# Patient Record
Sex: Male | Born: 1955 | Race: White | Hispanic: No | Marital: Single | Smoking: Former smoker
Health system: Southern US, Community
[De-identification: ages and names within clinical notes are randomized; demographics above are authoritative.]

## PROBLEM LIST (undated history)

## (undated) ENCOUNTER — Emergency Department (HOSPITAL_COMMUNITY): Discharge status: Absent without leave | Payer: Self-pay

## (undated) DIAGNOSIS — J189 Pneumonia, unspecified organism: Secondary | ICD-10-CM

## (undated) DIAGNOSIS — K611 Rectal abscess: Secondary | ICD-10-CM

## (undated) HISTORY — PX: SHOULDER SURGERY: SHX246

---

## 2013-12-23 ENCOUNTER — Encounter (HOSPITAL_COMMUNITY): Payer: Self-pay | Admitting: Emergency Medicine

## 2013-12-23 ENCOUNTER — Emergency Department (HOSPITAL_COMMUNITY)
Admission: EM | Admit: 2013-12-23 | Discharge: 2013-12-23 | Disposition: A | Discharge status: Home / Self Care | Payer: Self-pay | Attending: Emergency Medicine | Admitting: Emergency Medicine

## 2013-12-23 ENCOUNTER — Emergency Department (HOSPITAL_COMMUNITY): Payer: Self-pay

## 2013-12-23 DIAGNOSIS — R197 Diarrhea, unspecified: Secondary | ICD-10-CM | POA: Insufficient documentation

## 2013-12-23 DIAGNOSIS — Z87891 Personal history of nicotine dependence: Secondary | ICD-10-CM | POA: Insufficient documentation

## 2013-12-23 DIAGNOSIS — Z79899 Other long term (current) drug therapy: Secondary | ICD-10-CM | POA: Insufficient documentation

## 2013-12-23 DIAGNOSIS — K611 Rectal abscess: Secondary | ICD-10-CM

## 2013-12-23 DIAGNOSIS — K612 Anorectal abscess: Secondary | ICD-10-CM | POA: Insufficient documentation

## 2013-12-23 DIAGNOSIS — Z792 Long term (current) use of antibiotics: Secondary | ICD-10-CM | POA: Insufficient documentation

## 2013-12-23 DIAGNOSIS — R509 Fever, unspecified: Secondary | ICD-10-CM | POA: Insufficient documentation

## 2013-12-23 LAB — CBC WITH DIFFERENTIAL/PLATELET
Basophils Absolute: 0 10*3/uL (ref 0.0–0.1)
Basophils Relative: 0 % (ref 0–1)
EOS ABS: 0.1 10*3/uL (ref 0.0–0.7)
Eosinophils Relative: 1 % (ref 0–5)
HCT: 43.1 % (ref 39.0–52.0)
HEMOGLOBIN: 14.8 g/dL (ref 13.0–17.0)
LYMPHS PCT: 18 % (ref 12–46)
Lymphs Abs: 1.7 10*3/uL (ref 0.7–4.0)
MCH: 29.4 pg (ref 26.0–34.0)
MCHC: 34.3 g/dL (ref 30.0–36.0)
MCV: 85.5 fL (ref 78.0–100.0)
Monocytes Absolute: 1 10*3/uL (ref 0.1–1.0)
Monocytes Relative: 10 % (ref 3–12)
NEUTROS ABS: 6.6 10*3/uL (ref 1.7–7.7)
NEUTROS PCT: 71 % (ref 43–77)
PLATELETS: 269 10*3/uL (ref 150–400)
RBC: 5.04 MIL/uL (ref 4.22–5.81)
RDW: 12.8 % (ref 11.5–15.5)
WBC: 9.3 10*3/uL (ref 4.0–10.5)

## 2013-12-23 LAB — BASIC METABOLIC PANEL
BUN: 11 mg/dL (ref 6–23)
CALCIUM: 9 mg/dL (ref 8.4–10.5)
CO2: 26 mEq/L (ref 19–32)
Chloride: 98 mEq/L (ref 96–112)
Creatinine, Ser: 0.91 mg/dL (ref 0.50–1.35)
GFR calc Af Amer: >90 mL/min (ref >90)
Glucose, Bld: 111 mg/dL — ABNORMAL HIGH (ref 70–99)
POTASSIUM: 4 meq/L (ref 3.7–5.3)
Sodium: 138 mEq/L (ref 137–147)

## 2013-12-23 LAB — URINALYSIS, ROUTINE W REFLEX MICROSCOPIC
Bilirubin Urine: NEGATIVE
Glucose, UA: NEGATIVE mg/dL
Ketones, ur: NEGATIVE mg/dL
LEUKOCYTES UA: NEGATIVE
Nitrite: NEGATIVE
PROTEIN: NEGATIVE mg/dL
SPECIFIC GRAVITY, URINE: 1.015 (ref 1.005–1.030)
UROBILINOGEN UA: 0.2 mg/dL (ref 0.0–1.0)
pH: 5 (ref 5.0–8.0)

## 2013-12-23 LAB — URINE MICROSCOPIC-ADD ON

## 2013-12-23 MED ORDER — MORPHINE SULFATE 4 MG/ML IJ SOLN
4.0000 mg | Freq: Once | INTRAMUSCULAR | Status: AC
  Administered 2013-12-23: 4 mg via INTRAVENOUS
  Filled 2013-12-23: qty 1

## 2013-12-23 MED ORDER — METRONIDAZOLE 500 MG PO TABS: 500.0000 mg | ORAL_TABLET | Freq: Three times a day (TID) | ORAL | Status: DC

## 2013-12-23 MED ORDER — IOHEXOL 300 MG/ML  SOLN
100.0000 mL | Freq: Once | INTRAMUSCULAR | Status: AC | PRN
  Administered 2013-12-23: 100 mL via INTRAVENOUS

## 2013-12-23 MED ORDER — SODIUM CHLORIDE 0.9 % IV BOLUS (SEPSIS)
500.0000 mL | Freq: Once | INTRAVENOUS | Status: AC
  Administered 2013-12-23: 500 mL via INTRAVENOUS

## 2013-12-23 MED ORDER — IOHEXOL 300 MG/ML  SOLN: 100.0000 mL | Freq: Once | INTRAMUSCULAR | Status: DC | PRN

## 2013-12-23 MED ORDER — CIPROFLOXACIN HCL 500 MG PO TABS: 500.0000 mg | ORAL_TABLET | Freq: Two times a day (BID) | ORAL | Status: DC

## 2013-12-23 MED ORDER — HYDROMORPHONE HCL PF 1 MG/ML IJ SOLN
0.5000 mg | Freq: Once | INTRAMUSCULAR | Status: AC
  Administered 2013-12-23: 0.5 mg via INTRAVENOUS
  Filled 2013-12-23: qty 1

## 2013-12-23 MED ORDER — POLYETHYLENE GLYCOL 3350 17 G PO PACK: 17.0000 g | PACK | Freq: Every day | ORAL | Status: AC

## 2013-12-23 MED ORDER — OXYCODONE-ACETAMINOPHEN 5-325 MG PO TABS: 1.0000 | ORAL_TABLET | Freq: Four times a day (QID) | ORAL | Status: DC | PRN

## 2013-12-23 NOTE — Care Management Note (Signed)
ED/CM noted patient did not have health insurance and/or PCP listed in the computer.  Patient was given the [place] resource handout with information on the clinics, food pantries, and the handout for new health insurance sign-up.  Patient expressed appreciation for information received. 

## 2013-12-23 NOTE — Discharge Instructions (Signed)
Peri-Rectal Abscess °Your caregiver has diagnosed you as having a peri-rectal abscess. This is an infected area near the rectum that is filled with pus. If the abscess is near the surface of the skin, your caregiver may open (incise) the area and drain the pus. °HOME CARE INSTRUCTIONS  °· If your abscess was opened up and drained. A small piece of gauze may be placed in the opening so that it can drain. Do not remove the gauze unless directed by your caregiver. °· A loose dressing may be placed over the abscess site. Change the dressing as often as necessary to keep it clean and dry. °· After the drain is removed, the area may be washed with a gentle antiseptic (soap) four times per day. °· A warm sitz bath, warm packs or heating pad may be used for pain relief, taking care not to burn yourself. °· Return for a wound check in 1 day or as directed. °· An "inflatable doughnut" may be used for sitting with added comfort. These can be purchased at a drugstore or medical supply house. °· To reduce pain and straining with bowel movements, eat a high fiber diet with plenty of fruits and vegetables. Use stool softeners as recommended by your caregiver. This is especially important if narcotic type pain medications were prescribed as these may cause marked constipation. °· Only take over-the-counter or prescription medicines for pain, discomfort, or fever as directed by your caregiver. °SEEK IMMEDIATE MEDICAL CARE IF:  °· You have increasing pain that is not controlled by medication. °· There is increased inflammation (redness), swelling, bleeding, or drainage from the area. °· An oral temperature above 102° F (38.9° C) develops. °· You develop chills or generalized malaise (feel lethargic or feel "washed out"). °· You develop any new symptoms (problems) you feel may be related to your present problem. °Document Released: 07/07/2000 Document Revised: 10/02/2011 Document Reviewed: 07/07/2008 °[employer]® Patient Information  ©2014 [employer]. ° °

## 2013-12-23 NOTE — ED Notes (Signed)
Pt c/o rectal pain that "radiates to my private parts" since Saturday. Pt also reports difficulty urinating and decreased BMs. Pt also reports hx of "prostate problems".

## 2013-12-23 NOTE — ED Notes (Signed)
Pt c/o rectal pain that radiates to testicle area, difficulty having bowel movements and urination, denies any blood in urine or stool, states that he is able to have bowel movement and urination but that they are "different"

## 2013-12-23 NOTE — ED Provider Notes (Signed)
CSN: [id number]     Arrival date & time 12/23/13  1003 History  This chart was scribed for [employer]. Rubin Payor, MD,  by Ashley Jacobs, ED Scribe. The patient was seen in room APA11/APA11 and the patient's care was started at 10:34 AM.    First MD Initiated Contact with Patient 12/23/13 1007     Chief Complaint  Patient presents with  . Rectal Pain     (Consider location/radiation/quality/duration/timing/severity/associated sxs/prior Treatment) The history is provided by the patient and medical records. No language interpreter was used.   HPI Comments: Terry [name] is a 58 y.o. male who presents to the Emergency Department complaining of rectal pain, onset four days ago.  Denies injury or trauma. The pain is between his rectum and his genital. Pt has the associated symptoms of sacrum pain, rectal swelling, difficulty urinating, loose painful BM, fever (99.2) and decreased BM.  He tried Preparation H without any relief. Pt has a hx of prostatitis and was treated with antibiotics. Denies any allergies to medication.    History reviewed. No pertinent past medical history. Past Surgical History  Procedure Laterality Date  . Shoulder surgery     History reviewed. No pertinent family history. History  Substance Use Topics  . Smoking status: Former Games developer  . Smokeless tobacco: Not on file  . Alcohol Use: No    Review of Systems  Constitutional: Positive for fever.  Gastrointestinal: Positive for diarrhea and rectal pain.  Genitourinary: Positive for difficulty urinating.       Groin  pain  Musculoskeletal: Positive for arthralgias.  All other systems reviewed and are negative.     Allergies  Review of patient's allergies indicates no known allergies.  Home Medications   Prior to Admission medications   Medication Sig Start Date End Date Taking? Authorizing Provider  acetaminophen (TYLENOL) 500 MG tablet Take 1,500 mg by mouth daily as needed for moderate pain.   Yes  Historical Provider, MD  Docusate Sodium (DULCOLAX STOOL SOFTENER PO) Take 3 tablets by mouth daily as needed (constipation).   Yes Historical Provider, MD  HYDROcodone-acetaminophen (NORCO/VICODIN) 5-325 MG per tablet Take 2 tablets by mouth every 6 (six) hours as needed for moderate pain.   Yes Historical Provider, MD  ciprofloxacin (CIPRO) 500 MG tablet Take 1 tablet (500 mg total) by mouth 2 (two) times daily. 12/23/13   Juliet Rude. Jeena Arnett, MD  metroNIDAZOLE (FLAGYL) 500 MG tablet Take 1 tablet (500 mg total) by mouth 3 (three) times daily. 12/23/13   Juliet Rude. Maja Mccaffery, MD  oxyCODONE-acetaminophen (PERCOCET/ROXICET) 5-325 MG per tablet Take 1-2 tablets by mouth every 6 (six) hours as needed for severe pain. 12/23/13   Juliet Rude. Emoni Whitworth, MD  polyethylene glycol (MIRALAX / GLYCOLAX) packet Take 17 g by mouth daily. 12/23/13   Juliet Rude. Brooke Payes, MD   BP 160/89  Pulse 82  Temp(Src) 97.8 F (36.6 C) (Oral)  Resp 20  Ht 5\' 9"  (1.753 m)  Wt 190 lb (86.183 kg)  BMI 28.05 kg/m2  SpO2 96% Physical Exam  Nursing note and vitals reviewed. Constitutional: He is oriented to person, place, and time. He appears well-developed and well-nourished.  HENT:  Head: Normocephalic.  Eyes: Pupils are equal, round, and reactive to light.  Neck: Normal range of motion.  Cardiovascular: Normal rate.   Pulmonary/Chest: Effort normal.  Abdominal: Soft. He exhibits no distension. There is tenderness. There is no rebound and no guarding.  Mild suprapubic tenderness   Genitourinary:  Tenderness with insertion of  finger anteriorly and posteriorly.  No peritoneal mass or edema.   Neurological: He is alert and oriented to person, place, and time.  Skin: Skin is warm and dry. He is not diaphoretic.    ED Course  Procedures (including critical care time) DIAGNOSTIC STUDIES: Oxygen Saturation is 96% on room air, normal by my interpretation.    COORDINATION OF CARE:  10:38 AM Discussed course of care with pt  which includes rectal examination and CT scan. Pt understands and agrees.   Labs Review Labs Reviewed  URINALYSIS, ROUTINE W REFLEX MICROSCOPIC - Abnormal; Notable for the following:    Hgb urine dipstick TRACE (*)    All other components within normal limits  BASIC METABOLIC PANEL - Abnormal; Notable for the following:    Glucose, Bld 111 (*)    All other components within normal limits  CBC WITH DIFFERENTIAL  URINE MICROSCOPIC-ADD ON    Imaging Review Ct Abdomen Pelvis W Contrast  12/23/2013   CLINICAL DATA:  Rectal pain  EXAM: CT ABDOMEN AND PELVIS WITH CONTRAST  TECHNIQUE: Multidetector CT imaging of the abdomen and pelvis was performed using the standard protocol following bolus administration of intravenous contrast.  CONTRAST:  100mL OMNIPAQUE IOHEXOL 300 MG/ML  SOLN  COMPARISON:  None.  FINDINGS: The lung bases are free of acute infiltrate or sizable effusion. A small 3 mm subpleural nodule is noted on the first image in the lung fields.  The liver, gallbladder, spleen, adrenal glands and pancreas are within normal limits. The kidneys are well visualized bilaterally without renal calculi or urinary tract obstructive changes. Normal excretion of contrast is noted bilaterally. Colon is predominantly fluid filled. The appendix is within normal limits. Adjacent to the rectum there is a curvilinear fluid collection identified which measures 2.9 x 2.2 cm in greatest dimension. This would be consistent with a focal perirectal abscess. No significant lymphadenopathy is noted. No other pelvic abnormality is seen. The osseous structures are grossly unremarkable.  IMPRESSION: Fluid collection surrounding the distal rectum likely representing a perirectal abscess.  Tiny subpleural nodule in the left lower lobe. If the patient is at high risk for bronchogenic carcinoma, follow-up chest CT at 1 year is recommended. If the patient is at low risk, no follow-up is needed. This recommendation follows the  consensus statement: Guidelines for Management of Small Pulmonary Nodules Detected on CT Scans: A Statement from the Fleischner Society as published in Radiology 2005; 237:395-400.   Electronically Signed   By: Alcide CleverMark  Lukens M.D.   On: 12/23/2013 11:34     EKG Interpretation None      MDM   Final diagnoses:  Perirectal abscess    Patient with rectal pain. Perirectal abscess on CT. Discussed with Dr. Lovell SheehanJenkins will see a followup in 2 days. Will start Cipro and Flagyl. We'll give pain medicines and laxatives. Also patient was told of his lung nodule but will need to be followed in a year. Will discharge home   I personally performed the services described in this documentation, which was scribed in my presence. The recorded information has been reviewed and is accurate.      Juliet RudeNathan R. Rubin PayorPickering, MD 12/23/13 1254

## 2015-09-29 ENCOUNTER — Emergency Department (HOSPITAL_COMMUNITY): Payer: Self-pay

## 2015-09-29 ENCOUNTER — Inpatient Hospital Stay (HOSPITAL_COMMUNITY)
Admission: EM | Admit: 2015-09-29 | Discharge: 2015-10-03 | DRG: 348 | Disposition: A | Discharge status: Home / Self Care | Payer: Self-pay | Attending: Internal Medicine | Admitting: Internal Medicine

## 2015-09-29 ENCOUNTER — Encounter (HOSPITAL_COMMUNITY): Payer: Self-pay | Admitting: Emergency Medicine

## 2015-09-29 DIAGNOSIS — K611 Rectal abscess: Principal | ICD-10-CM | POA: Diagnosis present

## 2015-09-29 DIAGNOSIS — R7401 Elevation of levels of liver transaminase levels: Secondary | ICD-10-CM | POA: Diagnosis present

## 2015-09-29 DIAGNOSIS — Z87891 Personal history of nicotine dependence: Secondary | ICD-10-CM

## 2015-09-29 DIAGNOSIS — I4892 Unspecified atrial flutter: Secondary | ICD-10-CM | POA: Diagnosis not present

## 2015-09-29 DIAGNOSIS — K649 Unspecified hemorrhoids: Secondary | ICD-10-CM | POA: Diagnosis present

## 2015-09-29 DIAGNOSIS — I4891 Unspecified atrial fibrillation: Secondary | ICD-10-CM | POA: Diagnosis not present

## 2015-09-29 DIAGNOSIS — I480 Paroxysmal atrial fibrillation: Secondary | ICD-10-CM | POA: Diagnosis not present

## 2015-09-29 DIAGNOSIS — R740 Nonspecific elevation of levels of transaminase and lactic acid dehydrogenase [LDH]: Secondary | ICD-10-CM | POA: Diagnosis present

## 2015-09-29 DIAGNOSIS — K644 Residual hemorrhoidal skin tags: Secondary | ICD-10-CM | POA: Diagnosis present

## 2015-09-29 HISTORY — DX: Rectal abscess: K61.1

## 2015-09-29 HISTORY — DX: Pneumonia, unspecified organism: J18.9

## 2015-09-29 LAB — RAPID URINE DRUG SCREEN, HOSP PERFORMED
Amphetamines: NOT DETECTED
BARBITURATES: NOT DETECTED
BENZODIAZEPINES: NOT DETECTED
Cocaine: NOT DETECTED
Opiates: NOT DETECTED
Tetrahydrocannabinol: NOT DETECTED

## 2015-09-29 LAB — COMPREHENSIVE METABOLIC PANEL
ALK PHOS: 138 U/L — AB (ref 38–126)
ALT: 182 U/L — AB (ref 17–63)
ANION GAP: 10 (ref 5–15)
AST: 106 U/L — ABNORMAL HIGH (ref 15–41)
Albumin: 4 g/dL (ref 3.5–5.0)
BILIRUBIN TOTAL: 1.5 mg/dL — AB (ref 0.3–1.2)
BUN: 11 mg/dL (ref 6–20)
CALCIUM: 8.7 mg/dL — AB (ref 8.9–10.3)
CO2: 24 mmol/L (ref 22–32)
CREATININE: 0.89 mg/dL (ref 0.61–1.24)
Chloride: 104 mmol/L (ref 101–111)
GFR calc non Af Amer: >60 mL/min (ref >60)
GLUCOSE: 87 mg/dL (ref 65–99)
Potassium: 4.4 mmol/L (ref 3.5–5.1)
SODIUM: 138 mmol/L (ref 135–145)
TOTAL PROTEIN: 7.2 g/dL (ref 6.5–8.1)

## 2015-09-29 LAB — CBC WITH DIFFERENTIAL/PLATELET
Basophils Absolute: 0 10*3/uL (ref 0.0–0.1)
Basophils Relative: 0 %
EOS ABS: 0.1 10*3/uL (ref 0.0–0.7)
Eosinophils Relative: 1 %
HCT: 42.3 % (ref 39.0–52.0)
HEMOGLOBIN: 14.4 g/dL (ref 13.0–17.0)
LYMPHS PCT: 17 %
Lymphs Abs: 2.2 10*3/uL (ref 0.7–4.0)
MCH: 29.2 pg (ref 26.0–34.0)
MCHC: 34 g/dL (ref 30.0–36.0)
MCV: 85.8 fL (ref 78.0–100.0)
Monocytes Absolute: 1.1 10*3/uL — ABNORMAL HIGH (ref 0.1–1.0)
Monocytes Relative: 9 %
NEUTROS PCT: 73 %
Neutro Abs: 9.6 10*3/uL — ABNORMAL HIGH (ref 1.7–7.7)
Platelets: 235 10*3/uL (ref 150–400)
RBC: 4.93 MIL/uL (ref 4.22–5.81)
RDW: 12.9 % (ref 11.5–15.5)
WBC: 13 10*3/uL — AB (ref 4.0–10.5)

## 2015-09-29 MED ORDER — IOHEXOL 300 MG/ML  SOLN
100.0000 mL | Freq: Once | INTRAMUSCULAR | Status: AC | PRN
  Administered 2015-09-29: 100 mL via INTRAVENOUS

## 2015-09-29 MED ORDER — OXYCODONE-ACETAMINOPHEN 5-325 MG PO TABS
2.0000 | ORAL_TABLET | Freq: Once | ORAL | Status: AC
  Administered 2015-09-29: 2 via ORAL
  Filled 2015-09-29: qty 2

## 2015-09-29 MED ORDER — ACETAMINOPHEN 325 MG PO TABS
650.0000 mg | ORAL_TABLET | Freq: Four times a day (QID) | ORAL | Status: DC | PRN
  Administered 2015-09-30 (×2): 650 mg via ORAL
  Filled 2015-09-29 (×2): qty 2

## 2015-09-29 MED ORDER — ONDANSETRON HCL 4 MG PO TABS: 4.0000 mg | ORAL_TABLET | Freq: Four times a day (QID) | ORAL | Status: DC | PRN

## 2015-09-29 MED ORDER — DEXTROSE 5 % IV SOLN
2.0000 g | INTRAVENOUS | Status: DC
  Administered 2015-09-29 – 2015-10-02 (×4): 2 g via INTRAVENOUS
  Filled 2015-09-29 (×5): qty 2

## 2015-09-29 MED ORDER — METRONIDAZOLE IN NACL 5-0.79 MG/ML-% IV SOLN
500.0000 mg | Freq: Once | INTRAVENOUS | Status: AC
  Administered 2015-09-29: 500 mg via INTRAVENOUS
  Filled 2015-09-29: qty 100

## 2015-09-29 MED ORDER — CIPROFLOXACIN IN D5W 400 MG/200ML IV SOLN
400.0000 mg | Freq: Once | INTRAVENOUS | Status: AC
  Administered 2015-09-29: 400 mg via INTRAVENOUS
  Filled 2015-09-29: qty 200

## 2015-09-29 MED ORDER — ACETAMINOPHEN 650 MG RE SUPP: 650.0000 mg | Freq: Four times a day (QID) | RECTAL | Status: DC | PRN

## 2015-09-29 MED ORDER — MORPHINE SULFATE (PF) 2 MG/ML IV SOLN
2.0000 mg | INTRAVENOUS | Status: DC | PRN
  Administered 2015-09-29: 2 mg via INTRAVENOUS
  Filled 2015-09-29: qty 1

## 2015-09-29 MED ORDER — ENOXAPARIN SODIUM 40 MG/0.4ML ~~LOC~~ SOLN
40.0000 mg | SUBCUTANEOUS | Status: DC
  Administered 2015-09-30 – 2015-10-02 (×2): 40 mg via SUBCUTANEOUS
  Filled 2015-09-29 (×3): qty 0.4

## 2015-09-29 MED ORDER — HYDROMORPHONE HCL 1 MG/ML IJ SOLN
1.0000 mg | Freq: Once | INTRAMUSCULAR | Status: DC
  Filled 2015-09-29: qty 1

## 2015-09-29 MED ORDER — VANCOMYCIN HCL 10 G IV SOLR
1500.0000 mg | Freq: Once | INTRAVENOUS | Status: AC
  Administered 2015-09-29: 1500 mg via INTRAVENOUS
  Filled 2015-09-29: qty 1500

## 2015-09-29 MED ORDER — OXYCODONE HCL 5 MG PO TABS
10.0000 mg | ORAL_TABLET | ORAL | Status: DC | PRN
  Administered 2015-09-30 – 2015-10-03 (×6): 10 mg via ORAL
  Filled 2015-09-29 (×6): qty 2

## 2015-09-29 MED ORDER — MORPHINE SULFATE (PF) 2 MG/ML IV SOLN
2.0000 mg | Freq: Once | INTRAVENOUS | Status: AC
  Administered 2015-09-29: 2 mg via INTRAVENOUS
  Filled 2015-09-29: qty 1

## 2015-09-29 MED ORDER — SODIUM CHLORIDE 0.9 % IV BOLUS (SEPSIS)
1000.0000 mL | Freq: Once | INTRAVENOUS | Status: AC
  Administered 2015-09-29: 1000 mL via INTRAVENOUS

## 2015-09-29 MED ORDER — OXYCODONE HCL 5 MG PO TABS
10.0000 mg | ORAL_TABLET | Freq: Once | ORAL | Status: AC
  Administered 2015-09-29: 10 mg via ORAL
  Filled 2015-09-29: qty 2

## 2015-09-29 MED ORDER — HYDROMORPHONE HCL 1 MG/ML IJ SOLN
1.0000 mg | Freq: Once | INTRAMUSCULAR | Status: AC
  Administered 2015-09-29: 1 mg via INTRAVENOUS
  Filled 2015-09-29: qty 1

## 2015-09-29 MED ORDER — VANCOMYCIN HCL IN DEXTROSE 1-5 GM/200ML-% IV SOLN
1000.0000 mg | Freq: Two times a day (BID) | INTRAVENOUS | Status: DC
  Administered 2015-09-30 – 2015-10-01 (×4): 1000 mg via INTRAVENOUS
  Filled 2015-09-29 (×3): qty 200

## 2015-09-29 MED ORDER — METRONIDAZOLE IN NACL 5-0.79 MG/ML-% IV SOLN
500.0000 mg | Freq: Three times a day (TID) | INTRAVENOUS | Status: DC
  Administered 2015-09-30 – 2015-10-02 (×7): 500 mg via INTRAVENOUS
  Filled 2015-09-29 (×8): qty 100

## 2015-09-29 MED ORDER — DEXTROSE 5 % IV SOLN
INTRAVENOUS | Status: AC
  Filled 2015-09-29: qty 2

## 2015-09-29 MED ORDER — ONDANSETRON HCL 4 MG/2ML IJ SOLN
4.0000 mg | Freq: Four times a day (QID) | INTRAMUSCULAR | Status: DC | PRN
  Administered 2015-10-01: 4 mg via INTRAVENOUS

## 2015-09-29 MED ORDER — OXYCODONE HCL 5 MG PO TABS
5.0000 mg | ORAL_TABLET | ORAL | Status: DC | PRN
  Administered 2015-09-29: 5 mg via ORAL
  Filled 2015-09-29: qty 1

## 2015-09-29 MED ORDER — MORPHINE SULFATE (PF) 2 MG/ML IV SOLN
2.0000 mg | INTRAVENOUS | Status: DC | PRN
  Administered 2015-09-30 (×2): 2 mg via INTRAVENOUS
  Filled 2015-09-29 (×2): qty 1

## 2015-09-29 NOTE — ED Provider Notes (Signed)
CSN: [id number]     Arrival date & time 09/29/15  1141 History   First MD Initiated Contact with Patient 09/29/15 1338     Chief Complaint  Patient presents with  . Rectal Pain     (Consider location/radiation/quality/duration/timing/severity/associated sxs/prior Treatment) HPI  60 year old male history of perirectal abscess here with signs and symptoms concerning for the same. He has pain with defecation and pain with since been worsening over the last 2-3 days. Some fevers and chills but no systemic symptoms such as nausea, vomiting, malaise. No purulence out of his rectum no other symptoms this time.  History reviewed. No pertinent past medical history. Past Surgical History  Procedure Laterality Date  . Shoulder surgery     History reviewed. No pertinent family history. Social History  Substance Use Topics  . Smoking status: Former Games developer  . Smokeless tobacco: None  . Alcohol Use: No    Review of Systems  Constitutional: Positive for fever and chills.  Gastrointestinal: Positive for rectal pain.  All other systems reviewed and are negative.     Allergies  Review of patient's allergies indicates no known allergies.  Home Medications   Prior to Admission medications   Medication Sig Start Date End Date Taking? Authorizing Provider  acetaminophen (TYLENOL) 500 MG tablet Take 2,000 mg by mouth daily as needed for moderate pain.    Yes Historical Provider, MD  ibuprofen (ADVIL,MOTRIN) 800 MG tablet Take 800 mg by mouth every 8 (eight) hours as needed.   Yes Historical Provider, MD  ciprofloxacin (CIPRO) 500 MG tablet Take 1 tablet (500 mg total) by mouth 2 (two) times daily. Patient not taking: Reported on 09/29/2015 12/23/13   Benjiman Core, MD  Docusate Sodium (DULCOLAX STOOL SOFTENER PO) Take 3 tablets by mouth daily as needed (constipation). Reported on 09/29/2015    Historical Provider, MD  HYDROcodone-acetaminophen (NORCO/VICODIN) 5-325 MG per tablet Take 2 tablets by  mouth every 6 (six) hours as needed for moderate pain. Reported on 09/29/2015    Historical Provider, MD  metroNIDAZOLE (FLAGYL) 500 MG tablet Take 1 tablet (500 mg total) by mouth 3 (three) times daily. Patient not taking: Reported on 09/29/2015 12/23/13   Benjiman Core, MD  oxyCODONE-acetaminophen (PERCOCET/ROXICET) 5-325 MG per tablet Take 1-2 tablets by mouth every 6 (six) hours as needed for severe pain. 12/23/13   Benjiman Core, MD  polyethylene glycol [facility] / GLYCOLAX) packet Take 17 g by mouth daily. Patient not taking: Reported on 09/29/2015 12/23/13   Benjiman Core, MD   BP 138/85 mmHg  Pulse 87  Temp(Src) 98.8 F (37.1 C) (Oral)  Resp 16  Ht  (1.727 m)  Wt 192 lb (87.091 kg)  BMI 29.20 kg/m2  SpO2 94% Physical Exam  Constitutional: He appears well-developed and well-nourished.  HENT:  Head: Normocephalic and atraumatic.  Neck: Normal range of motion.  Cardiovascular: Normal rate.   Pulmonary/Chest: Effort normal. No respiratory distress.  Abdominal: He exhibits no distension.  Genitourinary:  Left-sided rectal tenderness and induration no fluctuance No obvious areas of induration or fluctuance on exam of skin around the anus.  Musculoskeletal: Normal range of motion.  Neurological: He is alert.  Nursing note and vitals reviewed.   ED Course  Procedures (including critical care time) Labs Review Labs Reviewed  CBC WITH DIFFERENTIAL/PLATELET - Abnormal; Notable for the following:    WBC 13.0 (*)    Neutro Abs 9.6 (*)    Monocytes Absolute 1.1 (*)    All other components within normal  limits  COMPREHENSIVE METABOLIC PANEL - Abnormal; Notable for the following:    Calcium 8.7 (*)    AST 106 (*)    ALT 182 (*)    Alkaline Phosphatase 138 (*)    Total Bilirubin 1.5 (*)    All other components within normal limits    Imaging Review Ct Pelvis W Contrast  09/29/2015  CLINICAL DATA:  Rectal pain and swelling for 3 days EXAM: CT PELVIS WITH CONTRAST TECHNIQUE:  Multidetector CT imaging of the pelvis was performed using the standard protocol following the bolus administration of intravenous contrast. CONTRAST:  100mL OMNIPAQUE IOHEXOL 300 MG/ML  SOLN COMPARISON:  12/23/2013 FINDINGS: Urinary Tract:  No abnormality visualized. Bowel:  Unremarkable visualized pelvic bowel loops. Vascular/Lymphatic: No pathologically enlarged lymph nodes. No significant vascular abnormality seen. Reproductive:  No mass or other significant abnormality. Other: Perirectal fluid collection is identified measuring 3.2 x 1.3 x 4.1 cm, image 41 of series 6 and image 69 of series 4. Musculoskeletal:  L5-S1 degenerative disc disease noted. IMPRESSION: 1. Recurrent perirectal abscess. Electronically Signed   By: Signa Kellaylor  Stroud M.D.   On: 09/29/2015 15:51   I have personally reviewed and evaluated these images and lab results as part of my medical decision-making.   EKG Interpretation None      MDM   Final diagnoses:  Transaminasemia  Perirectal abscess    60 year old male with perirectal abscess. Discussed with Dr. Lovell SheehanJenkins to recommended pain control and antibiotics and if pain is unable to be controlled admission for the same. Attempted 3 separate doses for pain control and unable to obtain optimal control. will admit to medicine. Cipro/flagyl given in ed  Marily MemosJason Heike Pounds, MD 10/01/15 [phone]

## 2015-09-29 NOTE — ED Notes (Signed)
Pt states he has been having rectal pain and swelling for about 3 days.  Hx of perirectal abscess and feels the same.

## 2015-09-29 NOTE — ED Notes (Signed)
Attempted to call report to RN receiving patient assigned to [id number] - RN unable at this time and she will call back.

## 2015-09-29 NOTE — Progress Notes (Signed)
Pharmacy Antibiotic Note  Terry [name] is [name] 60 y.o. male admitted on 09/29/2015 with peri-rectal abscess.  Pharmacy has been consulted for VANCOMYCIN dosing.  Plan: Vancomycin 1500mg  IV now x 1 then Vancomycin 1000mg  IV q12hrs Check trough at steady state Monitor labs, renal fxn, progress and c/s  Height: 5\' 8"  (172.7 cm) Weight: 203 lb 4.8 oz (92.216 kg) IBW/kg (Calculated) : 68.4  Temp (24hrs), Avg:98.9 F (37.2 C), Min:98.8 F (37.1 C), Max:99 F (37.2 C)   Recent Labs Lab 09/29/15 1425 09/29/15 1427  WBC  --  13.0*  CREATININE 0.89  --     Estimated Creatinine Clearance: 96 mL/min (by C-G formula based on Cr of 0.89).    No Known Allergies  Anti-infectives    Start     Dose/Rate Route Frequency Ordered Stop   09/30/15 0900  vancomycin (VANCOCIN) IVPB 1000 mg/200 mL premix     1,000 mg 200 mL/hr over 60 Minutes Intravenous Every 12 hours 09/29/15 2048     09/30/15 0300  metroNIDAZOLE (FLAGYL) IVPB 500 mg     500 mg 100 mL/hr over 60 Minutes Intravenous Every 8 hours 09/29/15 2022     09/29/15 2200  vancomycin (VANCOCIN) 1,500 mg in sodium chloride 0.9 % 500 mL IVPB     1,500 mg 250 mL/hr over 120 Minutes Intravenous  Once 09/29/15 2048     09/29/15 2100  cefTRIAXone (ROCEPHIN) 2 g in dextrose 5 % 50 mL IVPB     2 g 100 mL/hr over 30 Minutes Intravenous Every 24 hours 09/29/15 2022     09/29/15 1645  ciprofloxacin (CIPRO) IVPB 400 mg     400 mg 200 mL/hr over 60 Minutes Intravenous  Once 09/29/15 1631 09/29/15 1800   09/29/15 1645  metroNIDAZOLE (FLAGYL) IVPB 500 mg     500 mg 100 mL/hr over 60 Minutes Intravenous  Once 09/29/15 1631 09/29/15 2005     No results found for this or any previous visit.  Thank you for allowing pharmacy to be [name] part of this patient's care.  Terry [name], Terry [name] 09/29/2015 8:48 PM

## 2015-09-29 NOTE — H&P (Signed)
History and Physical  Terry [name] NUU:[id number] DOB: 23-Mar-1956 [name]: 09/29/2015   PCP: No PCP Per Patient  Referring Physician: ED/ [name]  Chief Complaint: rectal pain  HPI:  60 y/o male with no previous medical problems presents with 3 day history of rectal pain. He states that he has had a perirectal abscess in the past that was surgically drained [name]. He tried to call [name] office today, but was unable to get an appointment and was told to come to the emergency department to seek further medical care. The patient noted some pain in his rectum with defecation 3 days ago which has progressively worsened during this period of time. He denies placing any foreign bodies in his rectum. He denies any recent injuries. He denies any fevers, chills, nausea, vomiting, diarrhea. There is no hematochezia or melena. He denies any abdominal pain, dysuria, hematuria. The patient states that he is not sexually active and denies any previous history of STDs. The patient denies any alcohol or illegal drug use. In the emergency department, the patient was afebrile hemodynamically stable. He was started on ciprofloxacin and metronidazole. WBC was 13.0. AST 106, ALT 182, ALK PHOSPHATASE 138, TOTAL BILIRUBIN 1.5. BMP WAS UNREMARKABLE.  CT pelvis in the emergency department revealed a 3.2 x 1.3 x 4.1 cm fluid collection in the perirectal area.  Assessment/Plan: Perirectal abscess  -Start vancomycin, ceftriaxone, metronidazole  -Consult placed for general surgery, [name]  -npo after MN -pain control Transaminasemia -HIV -Hep C antibody -Hep B surface antigen -RUQ ultrasound -UDS       History reviewed. No pertinent past medical history. Past Surgical History  Procedure Laterality Date  . Shoulder surgery     Social History:  reports that he has quit smoking. He does not have any smokeless tobacco history on file. He reports that he does not drink alcohol or use illicit  drugs.   History reviewed. No pertinent family history.   No Known Allergies    Prior to Admission medications   Medication Sig Start Date End Date Taking? Authorizing Provider  acetaminophen (TYLENOL) 500 MG tablet Take 2,000 mg by mouth daily as needed for moderate pain.    Yes Historical Provider, MD  ibuprofen (ADVIL,MOTRIN) 800 MG tablet Take 800 mg by mouth every 8 (eight) hours as needed.   Yes Historical Provider, MD  ciprofloxacin (CIPRO) 500 MG tablet Take 1 tablet (500 mg total) by mouth 2 (two) times daily. Patient not taking: Reported on 09/29/2015 12/23/13   Terry Belling, MD  Docusate Sodium (DULCOLAX STOOL SOFTENER PO) Take 3 tablets by mouth daily as needed (constipation). Reported on 09/29/2015    Historical Provider, MD  HYDROcodone-acetaminophen (NORCO/VICODIN) 5-325 MG per tablet Take 2 tablets by mouth every 6 (six) hours as needed for moderate pain. Reported on 09/29/2015    Historical Provider, MD  metroNIDAZOLE (FLAGYL) 500 MG tablet Take 1 tablet (500 mg total) by mouth 3 (three) times daily. Patient not taking: Reported on 09/29/2015 12/23/13   Terry Belling, MD  oxyCODONE-acetaminophen (PERCOCET/ROXICET) 5-325 MG per tablet Take 1-2 tablets by mouth every 6 (six) hours as needed for severe pain. 12/23/13   Terry Belling, MD  polyethylene glycol [facility] / GLYCOLAX) packet Take 17 g by mouth daily. Patient not taking: Reported on 09/29/2015 12/23/13   Terry Belling, MD    Review of Systems:  Constitutional:  No weight loss, night sweats, Fevers, chills, fatigue.  Head&Eyes: No headache.  No vision loss.  No eye pain or scotoma ENT:  No Difficulty swallowing,Tooth/dental problems,Sore throat,  No ear ache, post nasal drip,  Cardio-vascular:  No chest pain, Orthopnea, PND, swelling in lower extremities,  dizziness, palpitations  GI:  No  abdominal pain, nausea, vomiting, diarrhea, loss of appetite, hematochezia, melena, heartburn, indigestion, Resp:  No  shortness of breath with exertion or at rest. No cough. No coughing up of blood .No wheezing.No chest wall deformity  Skin:  no rash or lesions.  GU:  no dysuria, change in color of urine, no urgency or frequency. No flank pain.  Musculoskeletal:  No joint pain or swelling. No decreased range of motion. No back pain.  Psych:  No change in mood or affect. No depression or anxiety. Neurologic: No headache, no dysesthesia, no focal weakness, no vision loss. No syncope  Physical Exam: Filed Vitals:   09/29/15 1645 09/29/15 1700 09/29/15 1715 09/29/15 1730  BP:  138/66  138/85  Pulse: 86 89 92 87  Temp:      TempSrc:      Resp:      Height:      Weight:      SpO2: 97% 99% 100% 94%   General:  A&O x 3, NAD, nontoxic, pleasant/cooperative Head/Eye: No conjunctival hemorrhage, no icterus, [place]/AT, No nystagmus ENT:  No icterus,  No thrush, good dentition, no pharyngeal exudate Neck:  No masses, no lymphadenpathy, no bruits CV:  RRR, no rub, no gallop, no S3 Lung:  CTAB, good air movement, no wheeze, no rhonchi Abdomen: soft/NT, +BS, nondistended, no peritoneal signs Ext: No cyanosis, No rashes, No petechiae, No lymphangitis, No edema; right perirectal area with some erythema and induration. No necrosis or drainage noted. Tender to palpation around 3:00 in the perirectal area. Neuro: CNII-XII intact, strength 4/5 in bilateral upper and lower extremities, no dysmetria  Labs on Admission:  Basic Metabolic Panel:  Recent Labs Lab 09/29/15 1425  NA 138  K 4.4  CL 104  CO2 24  GLUCOSE 87  BUN 11  CREATININE 0.89  CALCIUM 8.7*   Liver Function Tests:  Recent Labs Lab 09/29/15 1425  AST 106*  ALT 182*  ALKPHOS 138*  BILITOT 1.5*  PROT 7.2  ALBUMIN 4.0   No results for input(s): LIPASE, AMYLASE in the last 168 hours. No results for input(s): AMMONIA in the last 168 hours. CBC:  Recent Labs Lab 09/29/15 1427  WBC 13.0*  NEUTROABS 9.6*  HGB 14.4  HCT 42.3  MCV 85.8    PLT 235   Cardiac Enzymes: No results for input(s): CKTOTAL, CKMB, CKMBINDEX, TROPONINI in the last 168 hours. BNP: Invalid input(s): POCBNP CBG: No results for input(s): GLUCAP in the last 168 hours.  Radiological Exams on Admission: Ct Pelvis W Contrast  09/29/2015  CLINICAL DATA:  Rectal pain and swelling for 3 days EXAM: CT PELVIS WITH CONTRAST TECHNIQUE: Multidetector CT imaging of the pelvis was performed using the standard protocol following the bolus administration of intravenous contrast. CONTRAST:  141m OMNIPAQUE IOHEXOL 300 MG/ML  SOLN COMPARISON:  12/23/2013 FINDINGS: Urinary Tract:  No abnormality visualized. Bowel:  Unremarkable visualized pelvic bowel loops. Vascular/Lymphatic: No pathologically enlarged lymph nodes. No significant vascular abnormality seen. Reproductive:  No mass or other significant abnormality. Other: Perirectal fluid collection is identified measuring 3.2 x 1.3 x 4.1 cm, image 41 of series 6 and image 69 of series 4. Musculoskeletal:  L5-S1 degenerative disc disease noted. IMPRESSION: 1. Recurrent perirectal abscess. Electronically Signed   By: TKerby Moors  M.D.   On: 09/29/2015 15:51        Time spent:45 minutes Code Status:   FULL Family Communication:   No Family at bedside   Scheryl Sanborn, DO  [facility] Pager [phone]  If 7PM-7AM, please contact night-coverage [email or web address] Password [facility] 09/29/2015, 6:11 PM

## 2015-09-30 ENCOUNTER — Inpatient Hospital Stay (HOSPITAL_COMMUNITY): Payer: Self-pay

## 2015-09-30 ENCOUNTER — Encounter (HOSPITAL_COMMUNITY): Payer: Self-pay | Admitting: Internal Medicine

## 2015-09-30 LAB — CBC
HEMATOCRIT: 38.6 % — AB (ref 39.0–52.0)
HEMOGLOBIN: 12.9 g/dL — AB (ref 13.0–17.0)
MCH: 28.9 pg (ref 26.0–34.0)
MCHC: 33.4 g/dL (ref 30.0–36.0)
MCV: 86.4 fL (ref 78.0–100.0)
Platelets: 253 10*3/uL (ref 150–400)
RBC: 4.47 MIL/uL (ref 4.22–5.81)
RDW: 13.2 % (ref 11.5–15.5)
WBC: 13.2 10*3/uL — ABNORMAL HIGH (ref 4.0–10.5)

## 2015-09-30 LAB — COMPREHENSIVE METABOLIC PANEL
ALBUMIN: 3.3 g/dL — AB (ref 3.5–5.0)
ALT: 124 U/L — ABNORMAL HIGH (ref 17–63)
ANION GAP: 8 (ref 5–15)
AST: 55 U/L — ABNORMAL HIGH (ref 15–41)
Alkaline Phosphatase: 123 U/L (ref 38–126)
BILIRUBIN TOTAL: 0.9 mg/dL (ref 0.3–1.2)
BUN: 11 mg/dL (ref 6–20)
CHLORIDE: 104 mmol/L (ref 101–111)
CO2: 26 mmol/L (ref 22–32)
Calcium: 8.4 mg/dL — ABNORMAL LOW (ref 8.9–10.3)
Creatinine, Ser: 0.96 mg/dL (ref 0.61–1.24)
GFR calc Af Amer: >60 mL/min (ref >60)
GFR calc non Af Amer: >60 mL/min (ref >60)
GLUCOSE: 103 mg/dL — AB (ref 65–99)
POTASSIUM: 4.4 mmol/L (ref 3.5–5.1)
SODIUM: 138 mmol/L (ref 135–145)
TOTAL PROTEIN: 6.5 g/dL (ref 6.5–8.1)

## 2015-09-30 LAB — MRSA PCR SCREENING: MRSA by PCR: NEGATIVE

## 2015-09-30 MED ORDER — HYDROMORPHONE HCL 1 MG/ML IJ SOLN
1.0000 mg | INTRAMUSCULAR | Status: DC | PRN
  Administered 2015-09-30 – 2015-10-01 (×5): 1 mg via INTRAVENOUS
  Filled 2015-09-30 (×8): qty 1

## 2015-09-30 NOTE — Progress Notes (Signed)
**Note De-identified Arhum Peeples Obfuscation** EKG reported to RN 

## 2015-09-30 NOTE — Progress Notes (Signed)
Progress Note   Page Lancon ZOX:[id number] DOB: [birth year] [name]: 10-06-15 PCP: No PCP Per Patient   Brief Narrative:   Terry [name] is an 60 y.o. male with a PMH of perirectal abscess 12/2013, who was admitted on October 06, 2015 with a recurrent perirectal abscess.  Assessment/Plan:   Principal Problem:   Perirectal abscess - Continue vancomycin, ceftriaxone and metronidazole. - Evaluated by Dr. Lovell Sheehan of surgery with plans to proceed with I&D 10/01/15. - Continue pain relief efforts. Morphine not helping.  Change to dilaudid 1 mg Q 4 hours PRN.  Active Problems:   Transaminasemia - Follow-up hepatitis screen, HIV serologies, right upper quadrant ultrasound. - UDS negative. - LFTs improving.    DVT Prophylaxis - Lovenox ordered.   Family Communication/Anticipated D/C date and plan/Code Status   Family Communication: Male visitor at the bedside. Disposition Plan/date: Home when stable. Surgery planned for 10/01/15.  Likely home 10/01/15 after surgery or 10/02/15. Code Status: Full code.   IV Access:    Peripheral IV   Procedures and diagnostic studies:   Ct Pelvis W Contrast  10-06-2015  CLINICAL DATA:  Rectal pain and swelling for 3 days EXAM: CT PELVIS WITH CONTRAST TECHNIQUE: Multidetector CT imaging of the pelvis was performed using the standard protocol following the bolus administration of intravenous contrast. CONTRAST:  OMNIPAQUE IOHEXOL 300 MG/ML  SOLN COMPARISON:  12/23/2013 FINDINGS: Urinary Tract:  No abnormality visualized. Bowel:  Unremarkable visualized pelvic bowel loops. Vascular/Lymphatic: No pathologically enlarged lymph nodes. No significant vascular abnormality seen. Reproductive:  No mass or other significant abnormality. Other: Perirectal fluid collection is identified measuring 3.2 x 1.3 x 4.1 cm, image 41 of series 6 and image 69 of series 4. Musculoskeletal:  L5-S1 degenerative disc disease noted. IMPRESSION: 1. Recurrent perirectal abscess.  Electronically Signed   By: Signa Kell M.D.   On: October 06, 2015 15:51     Medical Consultants:    None.  Anti-Infectives:   Rocephin 2015-10-06---> Vancomycin 10-06-15---> Flagyl October 06, 2015--->   Subjective:    Terry [name] has a lot of rectal discomfort.  He is running a fever.  Hungry.  Irritable.  No N/V.    Objective:    Filed Vitals:   06-Oct-2015 1953 10-06-2015 2004 09/30/15 0247 09/30/15 0308  BP: 155/73   144/75  Pulse: 89   88  Temp: 99 F (37.2 C)  101.1 F (38.4 C) 100.7 F (38.2 C)  TempSrc: Oral  Oral Oral  Resp: 20   18  Height:  (1.727 m)     Weight: 92.216 kg (203 lb 4.8 oz)     SpO2: 97% 97%  95%   No intake or output data in the 24 hours ending 09/30/15 0910 Filed Weights   10/06/15 1154 10-06-2015 1953  Weight: 87.091 kg (192 lb) 92.216 kg (203 lb 4.8 oz)    Exam: Gen:  NAD Cardiovascular:  RRR, No M/R/G Respiratory:  Lungs CTAB Gastrointestinal:  Abdomen soft, NT/ND, + BS Extremities:  No C/E/C   Data Reviewed:    Labs: Basic Metabolic Panel:  Recent Labs Lab 06-Oct-2015 1425 09/30/15 0610  NA 138 138  K 4.4 4.4  CL 104 104  CO2 24 26  GLUCOSE 87 103*  BUN 11 11  CREATININE 0.89 0.96  CALCIUM 8.7* 8.4*   GFR Estimated Creatinine Clearance: 89 mL/min (by C-G formula based on Cr of 0.96). Liver Function Tests:  Recent Labs Lab 10/06/15 1425 09/30/15 0610  AST 106* 55*  ALT 182* 124*  ALKPHOS 138* 123  BILITOT 1.5* 0.9  PROT 7.2 6.5  ALBUMIN 4.0 3.3*   CBC:  Recent Labs Lab 09/29/15 1427 09/30/15 0610  WBC 13.0* 13.2*  NEUTROABS 9.6*  --   HGB 14.4 12.9*  HCT 42.3 38.6*  MCV 85.8 86.4  PLT 235 253    Microbiology Recent Results (from the past 240 hour(s))  MRSA PCR Screening     Status: None   Collection Time: 09/29/15 11:00 PM  Result Value Ref Range Status   MRSA by PCR NEGATIVE NEGATIVE Final    Comment:        The GeneXpert MRSA Assay (FDA approved for NASAL specimens only), is one component of  a comprehensive MRSA colonization surveillance program. It is not intended to diagnose MRSA infection nor to guide or monitor treatment for MRSA infections.      Medications:   . cefTRIAXone (ROCEPHIN)  IV  2 g Intravenous Q24H  . enoxaparin (LOVENOX) injection  40 mg Subcutaneous Q24H  . metronidazole  500 mg Intravenous Q8H  . vancomycin  1,000 mg Intravenous Q12H   Continuous Infusions:   Time spent: 25 minutes.   LOS: 1 day   Kennia Vanvorst  [facility] Pager [phone]. If unable to reach me by pager, please call my cell phone at [phone].  *Please refer to [email or web address], password [email or web address] to get updated schedule on who will round on this patient, as hospitalists switch teams weekly. If 7PM-7AM, please contact night-coverage at [email or web address], password [email or web address] for any overnight needs.  09/30/2015, 9:10 AM

## 2015-09-30 NOTE — Consult Note (Signed)
Reason for Consult: Perirectal abscess Referring Physician: Hospitalist  Terry [name] is an 59 y.o. male.  HPI: Patient is a 59-year-old white male who presents with perirectal pain. He was found on CT scan to have a recurrent perirectal abscess. He last had one in June 2015. This has been occurring over the past few days.  History reviewed. No pertinent past medical history.  Past Surgical History  Procedure Laterality Date  . Shoulder surgery      History reviewed. No pertinent family history.  Social History:  reports that he has quit smoking. He does not have any smokeless tobacco history on file. He reports that he does not drink alcohol or use illicit drugs.  Allergies: No Known Allergies  Medications: I have reviewed the patient's current medications.  Results for orders placed or performed during the hospital encounter of 09/29/15 (from the past 48 hour(s))  Comprehensive metabolic panel     Status: Abnormal   Collection Time: 09/29/15  2:25 PM  Result Value Ref Range   Sodium 138 135 - 145 mmol/L   Potassium 4.4 3.5 - 5.1 mmol/L   Chloride 104 101 - 111 mmol/L   CO2 24 22 - 32 mmol/L   Glucose, Bld 87 65 - 99 mg/dL   BUN 11 6 - 20 mg/dL   Creatinine, Ser 0.89 0.61 - 1.24 mg/dL   Calcium 8.7 (L) 8.9 - 10.3 mg/dL   Total Protein 7.2 6.5 - 8.1 g/dL   Albumin 4.0 3.5 - 5.0 g/dL   AST 106 (H) 15 - 41 U/L   ALT 182 (H) 17 - 63 U/L   Alkaline Phosphatase 138 (H) 38 - 126 U/L   Total Bilirubin 1.5 (H) 0.3 - 1.2 mg/dL   GFR calc non Af Amer >60 >60 mL/min   GFR calc Af Amer >60 >60 mL/min    Comment: (NOTE) The eGFR has been calculated using the CKD EPI equation. This calculation has not been validated in all clinical situations. eGFR's persistently <60 mL/min signify possible Chronic Kidney Disease.    Anion gap 10 5 - 15  CBC with Differential     Status: Abnormal   Collection Time: 09/29/15  2:27 PM  Result Value Ref Range   WBC 13.0 (H) 4.0 - 10.5 K/uL   RBC  4.93 4.22 - 5.81 MIL/uL   Hemoglobin 14.4 13.0 - 17.0 g/dL   HCT 42.3 39.0 - 52.0 %   MCV 85.8 78.0 - 100.0 fL   MCH 29.2 26.0 - 34.0 pg   MCHC 34.0 30.0 - 36.0 g/dL   RDW 12.9 11.5 - 15.5 %   Platelets 235 150 - 400 K/uL   Neutrophils Relative % 73 %   Neutro Abs 9.6 (H) 1.7 - 7.7 K/uL   Lymphocytes Relative 17 %   Lymphs Abs 2.2 0.7 - 4.0 K/uL   Monocytes Relative 9 %   Monocytes Absolute 1.1 (H) 0.1 - 1.0 K/uL   Eosinophils Relative 1 %   Eosinophils Absolute 0.1 0.0 - 0.7 K/uL   Basophils Relative 0 %   Basophils Absolute 0.0 0.0 - 0.1 K/uL  Urine rapid drug screen (hosp performed)     Status: None   Collection Time: 09/29/15 10:55 PM  Result Value Ref Range   Opiates NONE DETECTED NONE DETECTED   Cocaine NONE DETECTED NONE DETECTED   Benzodiazepines NONE DETECTED NONE DETECTED   Amphetamines NONE DETECTED NONE DETECTED   Tetrahydrocannabinol NONE DETECTED NONE DETECTED   Barbiturates NONE DETECTED NONE   DETECTED    Comment:        DRUG SCREEN FOR MEDICAL PURPOSES ONLY.  IF CONFIRMATION IS NEEDED FOR ANY PURPOSE, NOTIFY LAB WITHIN 5 DAYS.        LOWEST DETECTABLE LIMITS FOR URINE DRUG SCREEN Drug Class       Cutoff (ng/mL) Amphetamine      1000 Barbiturate      200 Benzodiazepine   024 Tricyclics       097 Opiates          300 Cocaine          300 THC              50   MRSA PCR Screening     Status: None   Collection Time: 09/29/15 11:00 PM  Result Value Ref Range   MRSA by PCR NEGATIVE NEGATIVE    Comment:        The GeneXpert MRSA Assay (FDA approved for NASAL specimens only), is one component of a comprehensive MRSA colonization surveillance program. It is not intended to diagnose MRSA infection nor to guide or monitor treatment for MRSA infections.   Comprehensive metabolic panel     Status: Abnormal   Collection Time: 09/30/15  6:10 AM  Result Value Ref Range   Sodium 138 135 - 145 mmol/L   Potassium 4.4 3.5 - 5.1 mmol/L   Chloride 104 101 - 111  mmol/L   CO2 26 22 - 32 mmol/L   Glucose, Bld 103 (H) 65 - 99 mg/dL   BUN 11 6 - 20 mg/dL   Creatinine, Ser 0.96 0.61 - 1.24 mg/dL   Calcium 8.4 (L) 8.9 - 10.3 mg/dL   Total Protein 6.5 6.5 - 8.1 g/dL   Albumin 3.3 (L) 3.5 - 5.0 g/dL   AST 55 (H) 15 - 41 U/L   ALT 124 (H) 17 - 63 U/L   Alkaline Phosphatase 123 38 - 126 U/L   Total Bilirubin 0.9 0.3 - 1.2 mg/dL   GFR calc non Af Amer >60 >60 mL/min   GFR calc Af Amer >60 >60 mL/min    Comment: (NOTE) The eGFR has been calculated using the CKD EPI equation. This calculation has not been validated in all clinical situations. eGFR's persistently <60 mL/min signify possible Chronic Kidney Disease.    Anion gap 8 5 - 15  CBC     Status: Abnormal   Collection Time: 09/30/15  6:10 AM  Result Value Ref Range   WBC 13.2 (H) 4.0 - 10.5 K/uL   RBC 4.47 4.22 - 5.81 MIL/uL   Hemoglobin 12.9 (L) 13.0 - 17.0 g/dL   HCT 38.6 (L) 39.0 - 52.0 %   MCV 86.4 78.0 - 100.0 fL   MCH 28.9 26.0 - 34.0 pg   MCHC 33.4 30.0 - 36.0 g/dL   RDW 13.2 11.5 - 15.5 %   Platelets 253 150 - 400 K/uL    Ct Pelvis W Contrast  09/29/2015  CLINICAL DATA:  Rectal pain and swelling for 3 days EXAM: CT PELVIS WITH CONTRAST TECHNIQUE: Multidetector CT imaging of the pelvis was performed using the standard protocol following the bolus administration of intravenous contrast. CONTRAST:  11m OMNIPAQUE IOHEXOL 300 MG/ML  SOLN COMPARISON:  12/23/2013 FINDINGS: Urinary Tract:  No abnormality visualized. Bowel:  Unremarkable visualized pelvic bowel loops. Vascular/Lymphatic: No pathologically enlarged lymph nodes. No significant vascular abnormality seen. Reproductive:  No mass or other significant abnormality. Other: Perirectal fluid collection is identified measuring 3.2 x  1.3 x 4.1 cm, image 41 of series 6 and image 69 of series 4. Musculoskeletal:  L5-S1 degenerative disc disease noted. IMPRESSION: 1. Recurrent perirectal abscess. Electronically Signed   By: Kerby Moors M.D.    On: 09/29/2015 15:51    ROS: See chart Blood pressure 144/75, pulse 88, temperature 100.7 F (38.2 C), temperature source Oral, resp. rate 18, height 5' 8" (1.727 m), weight 92.216 kg (203 lb 4.8 oz), SpO2 95 %. Physical Exam: Pleasant white male who complains of perirectal pain. Rectal examination reveals a fluctuant area with induration in the period region, approximately the 4:00 position. Examination limited secondary to pain.  Assessment/Plan: Impression: Perirectal abscess Plan: Patient scheduled for incision and drainage of perirectal abscess tomorrow. Should this spontaneously rupture, then surgery would be canceled. The risks and benefits of the procedure were fully explained to the patient, who gave informed consent.  Winifred Bodiford A 09/30/2015, 9:16 AM

## 2015-10-01 ENCOUNTER — Encounter (HOSPITAL_COMMUNITY)
Admission: EM | Disposition: A | Discharge status: Home / Self Care | Payer: Self-pay | Source: Home / Self Care | Attending: Internal Medicine

## 2015-10-01 ENCOUNTER — Inpatient Hospital Stay (HOSPITAL_COMMUNITY): Payer: Self-pay | Admitting: Anesthesiology

## 2015-10-01 ENCOUNTER — Encounter (HOSPITAL_COMMUNITY): Payer: Self-pay | Admitting: *Deleted

## 2015-10-01 DIAGNOSIS — I4891 Unspecified atrial fibrillation: Secondary | ICD-10-CM | POA: Diagnosis not present

## 2015-10-01 DIAGNOSIS — K649 Unspecified hemorrhoids: Secondary | ICD-10-CM | POA: Diagnosis present

## 2015-10-01 DIAGNOSIS — K612 Anorectal abscess: Secondary | ICD-10-CM

## 2015-10-01 HISTORY — PX: HEMORRHOID SURGERY: SHX153

## 2015-10-01 LAB — HEPATITIS B SURFACE ANTIGEN: HEP B S AG: NEGATIVE

## 2015-10-01 LAB — BASIC METABOLIC PANEL
Anion gap: 11 (ref 5–15)
BUN: 12 mg/dL (ref 6–20)
CHLORIDE: 101 mmol/L (ref 101–111)
CO2: 25 mmol/L (ref 22–32)
CREATININE: 0.94 mg/dL (ref 0.61–1.24)
Calcium: 8.5 mg/dL — ABNORMAL LOW (ref 8.9–10.3)
GFR calc non Af Amer: >60 mL/min (ref >60)
Glucose, Bld: 117 mg/dL — ABNORMAL HIGH (ref 65–99)
POTASSIUM: 3.8 mmol/L (ref 3.5–5.1)
SODIUM: 137 mmol/L (ref 135–145)

## 2015-10-01 LAB — TROPONIN I
Troponin I: <0.03 ng/mL (ref <0.031)
Troponin I: <0.03 ng/mL (ref <0.031)

## 2015-10-01 LAB — TSH: TSH: 5.734 u[IU]/mL — ABNORMAL HIGH (ref 0.350–4.500)

## 2015-10-01 LAB — HIV ANTIBODY (ROUTINE TESTING W REFLEX): HIV Screen 4th Generation wRfx: NONREACTIVE

## 2015-10-01 SURGERY — HEMORRHOIDECTOMY
Anesthesia: General

## 2015-10-01 MED ORDER — LACTATED RINGERS IV SOLN
INTRAVENOUS | Status: DC
  Administered 2015-10-02: 08:00:00 via INTRAVENOUS

## 2015-10-01 MED ORDER — METOPROLOL TARTRATE 1 MG/ML IV SOLN
INTRAVENOUS | Status: AC
  Filled 2015-10-01: qty 5

## 2015-10-01 MED ORDER — FENTANYL CITRATE (PF) 100 MCG/2ML IJ SOLN
INTRAMUSCULAR | Status: AC
  Filled 2015-10-01: qty 2

## 2015-10-01 MED ORDER — METOPROLOL TARTRATE 1 MG/ML IV SOLN: 2.0000 mg | Freq: Once | INTRAVENOUS | Status: DC

## 2015-10-01 MED ORDER — PHENYLEPHRINE HCL 10 MG/ML IJ SOLN
INTRAMUSCULAR | Status: DC | PRN
  Administered 2015-10-01 (×5): 80 ug via INTRAVENOUS

## 2015-10-01 MED ORDER — METOPROLOL TARTRATE 1 MG/ML IV SOLN
2.5000 mg | INTRAVENOUS | Status: DC | PRN
  Administered 2015-10-01 – 2015-10-02 (×2): 2.5 mg via INTRAVENOUS
  Filled 2015-10-01 (×3): qty 5

## 2015-10-01 MED ORDER — MIDAZOLAM HCL 2 MG/2ML IJ SOLN
1.0000 mg | INTRAMUSCULAR | Status: DC | PRN
  Administered 2015-10-01: 2 mg via INTRAVENOUS

## 2015-10-01 MED ORDER — HYDROMORPHONE HCL 1 MG/ML IJ SOLN
1.0000 mg | Freq: Once | INTRAMUSCULAR | Status: AC
  Administered 2015-10-01: 1 mg via INTRAVENOUS

## 2015-10-01 MED ORDER — PROPOFOL 10 MG/ML IV BOLUS
INTRAVENOUS | Status: DC | PRN
  Administered 2015-10-01: 200 mg via INTRAVENOUS

## 2015-10-01 MED ORDER — METOPROLOL TARTRATE 1 MG/ML IV SOLN
INTRAVENOUS | Status: DC | PRN
  Administered 2015-10-01 (×2): 1 mg via INTRAVENOUS

## 2015-10-01 MED ORDER — LACTATED RINGERS IV SOLN
INTRAVENOUS | Status: DC
  Administered 2015-10-01: 10:00:00 via INTRAVENOUS

## 2015-10-01 MED ORDER — FENTANYL CITRATE (PF) 100 MCG/2ML IJ SOLN
25.0000 ug | INTRAMUSCULAR | Status: DC | PRN
  Administered 2015-10-01: 25 ug via INTRAVENOUS
  Administered 2015-10-01 (×2): 50 ug via INTRAVENOUS
  Administered 2015-10-01: 25 ug via INTRAVENOUS

## 2015-10-01 MED ORDER — METOPROLOL TARTRATE 1 MG/ML IV SOLN
5.0000 mg | Freq: Once | INTRAVENOUS | Status: AC
Start: 2015-10-01 — End: 2015-10-01
  Administered 2015-10-01: 5 mg via INTRAVENOUS

## 2015-10-01 MED ORDER — DILTIAZEM HCL 30 MG PO TABS
30.0000 mg | ORAL_TABLET | Freq: Four times a day (QID) | ORAL | Status: DC
  Administered 2015-10-01 – 2015-10-02 (×4): 30 mg via ORAL
  Filled 2015-10-01 (×8): qty 1

## 2015-10-01 MED ORDER — PROPOFOL 10 MG/ML IV BOLUS
INTRAVENOUS | Status: AC
  Filled 2015-10-01: qty 20

## 2015-10-01 MED ORDER — MIDAZOLAM HCL 2 MG/2ML IJ SOLN
INTRAMUSCULAR | Status: AC
  Filled 2015-10-01: qty 2

## 2015-10-01 MED ORDER — PHENYLEPHRINE 40 MCG/ML (10ML) SYRINGE FOR IV PUSH (FOR BLOOD PRESSURE SUPPORT)
PREFILLED_SYRINGE | INTRAVENOUS | Status: AC
  Filled 2015-10-01: qty 10

## 2015-10-01 MED ORDER — FENTANYL CITRATE (PF) 250 MCG/5ML IJ SOLN
INTRAMUSCULAR | Status: AC
  Filled 2015-10-01: qty 5

## 2015-10-01 MED ORDER — ONDANSETRON HCL 4 MG/2ML IJ SOLN: 4.0000 mg | Freq: Once | INTRAMUSCULAR | Status: DC | PRN

## 2015-10-01 MED ORDER — ONDANSETRON HCL 4 MG/2ML IJ SOLN
INTRAMUSCULAR | Status: AC
  Filled 2015-10-01: qty 2

## 2015-10-01 MED ORDER — LIDOCAINE VISCOUS 2 % MT SOLN
OROMUCOSAL | Status: DC | PRN
  Administered 2015-10-01: 1

## 2015-10-01 MED ORDER — LIDOCAINE HCL (PF) 1 % IJ SOLN
INTRAMUSCULAR | Status: AC
  Filled 2015-10-01: qty 5

## 2015-10-01 MED ORDER — FENTANYL CITRATE (PF) 100 MCG/2ML IJ SOLN
INTRAMUSCULAR | Status: DC | PRN
  Administered 2015-10-01 (×2): 50 ug via INTRAVENOUS

## 2015-10-01 MED ORDER — LIDOCAINE HCL (PF) 1 % IJ SOLN
INTRAMUSCULAR | Status: DC | PRN
  Administered 2015-10-01: 50 mg

## 2015-10-01 MED ORDER — LIDOCAINE VISCOUS 2 % MT SOLN
OROMUCOSAL | Status: AC
  Filled 2015-10-01: qty 15

## 2015-10-01 MED ORDER — DILTIAZEM HCL 25 MG/5ML IV SOLN
10.0000 mg | Freq: Once | INTRAVENOUS | Status: AC
  Administered 2015-10-01: 10 mg via INTRAVENOUS
  Filled 2015-10-01: qty 5

## 2015-10-01 MED ORDER — LORAZEPAM 2 MG/ML IJ SOLN: 1.0000 mg | INTRAMUSCULAR | Status: DC | PRN

## 2015-10-01 MED ORDER — OXYCODONE-ACETAMINOPHEN 5-325 MG PO TABS: 1.0000 | ORAL_TABLET | ORAL | Status: DC | PRN

## 2015-10-01 MED ORDER — DILTIAZEM HCL 25 MG/5ML IV SOLN
5.0000 mg | Freq: Once | INTRAVENOUS | Status: AC
  Administered 2015-10-01: 5 mg via INTRAVENOUS

## 2015-10-01 MED ORDER — BUPIVACAINE HCL (PF) 0.5 % IJ SOLN
INTRAMUSCULAR | Status: AC
  Filled 2015-10-01: qty 30

## 2015-10-01 MED ORDER — FENTANYL CITRATE (PF) 100 MCG/2ML IJ SOLN
25.0000 ug | INTRAMUSCULAR | Status: AC
  Administered 2015-10-01 (×2): 25 ug via INTRAVENOUS

## 2015-10-01 MED ORDER — SODIUM CHLORIDE 0.9 % IR SOLN
Status: DC | PRN
  Administered 2015-10-01: 1000 mL

## 2015-10-01 SURGICAL SUPPLY — 29 items
BAG HAMPER (MISCELLANEOUS) ×3 IMPLANT
BNDG CONFORM 2 STRL LF (GAUZE/BANDAGES/DRESSINGS) ×3 IMPLANT
CLOTH BEACON ORANGE TIMEOUT ST (SAFETY) ×3 IMPLANT
COVER LIGHT HANDLE STERIS (MISCELLANEOUS) ×6 IMPLANT
ELECT REM PT RETURN 9FT ADLT (ELECTROSURGICAL) ×3
ELECTRODE REM PT RTRN 9FT ADLT (ELECTROSURGICAL) ×1 IMPLANT
FORMALIN 10 PREFIL 120ML (MISCELLANEOUS) ×3 IMPLANT
GAUZE SPONGE 4X4 12PLY STRL (GAUZE/BANDAGES/DRESSINGS) ×3 IMPLANT
GLOVE BIOGEL M STRL SZ7.5 (GLOVE) ×3 IMPLANT
GLOVE BIOGEL PI IND STRL 7.0 (GLOVE) ×2 IMPLANT
GLOVE BIOGEL PI INDICATOR 7.0 (GLOVE) ×4
GLOVE ECLIPSE 6.5 STRL STRAW (GLOVE) ×3 IMPLANT
GLOVE SURG SS PI 7.5 STRL IVOR (GLOVE) ×3 IMPLANT
GOWN STRL REUS W/TWL LRG LVL3 (GOWN DISPOSABLE) ×6 IMPLANT
HEMOSTAT SURGICEL 4X8 (HEMOSTASIS) ×3 IMPLANT
KIT ROOM TURNOVER APOR (KITS) ×3 IMPLANT
LIGASURE IMPACT 36 18CM CVD LR (INSTRUMENTS) ×3 IMPLANT
MANIFOLD NEPTUNE II (INSTRUMENTS) ×3 IMPLANT
MARKER SKIN DUAL TIP RULER LAB (MISCELLANEOUS) ×3 IMPLANT
NS IRRIG 1000ML POUR BTL (IV SOLUTION) ×3 IMPLANT
PACK BASIC LIMB (CUSTOM PROCEDURE TRAY) IMPLANT
PACK MINOR (CUSTOM PROCEDURE TRAY) ×3 IMPLANT
PAD ABD 5X9 TENDERSORB (GAUZE/BANDAGES/DRESSINGS) IMPLANT
PAD ARMBOARD 7.5X6 YLW CONV (MISCELLANEOUS) ×3 IMPLANT
SET BASIN LINEN APH (SET/KITS/TRAYS/PACK) ×3 IMPLANT
SHEET LAVH (DRAPES) ×3 IMPLANT
SUT SILK 2 0 (SUTURE) ×2
SUT SILK 2-0 18XBRD TIE 12 (SUTURE) ×1 IMPLANT
SYR BULB IRRIGATION 50ML (SYRINGE) ×3 IMPLANT

## 2015-10-01 NOTE — Anesthesia Preprocedure Evaluation (Signed)
Anesthesia Evaluation  Patient identified by MRN, date of birth, ID band Patient awake    Reviewed: Allergy & Precautions, NPO status , Patient's Chart, lab work & pertinent test results  Airway Mallampati: II  TM Distance: >3 FB     Dental  (+) Teeth Intact   Pulmonary former smoker,    breath sounds clear to auscultation       Cardiovascular + dysrhythmias (new onset A-flutter VR=128)  Rhythm:Regular Rate:Tachycardia     Neuro/Psych    GI/Hepatic negative GI ROS,   Endo/Other    Renal/GU      Musculoskeletal   Abdominal   Peds  Hematology   Anesthesia Other Findings   Reproductive/Obstetrics                             Anesthesia Physical Anesthesia Plan  ASA: III  Anesthesia Plan: General   Post-op Pain Management:    Induction: Intravenous  Airway Management Planned: LMA  Additional Equipment:   Intra-op Plan:   Post-operative Plan: Extubation in OR  Informed Consent: I have reviewed the patients History and Physical, chart, labs and discussed the procedure including the risks, benefits and alternatives for the proposed anesthesia with the patient or authorized representative who has indicated his/her understanding and acceptance.     Plan Discussed with:   Anesthesia Plan Comments:         Anesthesia Quick Evaluation

## 2015-10-01 NOTE — Op Note (Signed)
Patient:  Terry [name]  DOB:  04/19/1956  MRN:  [id number]   Preop Diagnosis:  Perirectal abscess  Postop Diagnosis:  Same, external hemorrhoid  Procedure:  Examination under anesthesia, simple hemorrhoidectomy  Surgeon:  Franky MachoMark Donold Marotto, M.D.  Anes:  Gen.  Indications:  Patient is a 60 year old white male who presented emergency room with worsening perirectal pain. He was noted on CT scan the abdomen and pelvis to have a perirectal abscess. Preoperatively, it was difficult to do a digital examination. This morning, he states that his rectal pain has eased, but on digital examination, the patient had a large external hemorrhoid present. The patient is being brought back to the operating room for an examination under anesthesia, possible I and D of perirectal abscess, hemorrhoidectomy. The risks and benefits of the procedures including bleeding, infection, and recurrence of hemorrhoidal disease were fully explained to the patient, who gave informed consent.  Procedure note:  The patient was placed in the lithotomy position after general anesthesia was administered. The perineum was prepped and draped using the usual sterile technique with Betadine. Surgical site confirmation was performed.  On anoscopy, no abscess cavity could be fully identified. It is suspected that the patient had already drain this region at the dentate line. The patient had a large developing external hemorrhoid at the 7:00 position. It was elected to remove this using the LigaSure. The specimen was sent to pathology further examination. No fistulas were identified. Surgicel and Viscous Xylocaine rectal packing was then placed.  All tape and needle counts were correct the end of the procedure. The patient was awakened and transferred to PACU in stable condition.    Complications:  None  EBL:  Minimal  Specimen:  Hemorrhoid

## 2015-10-01 NOTE — Anesthesia Postprocedure Evaluation (Signed)
Anesthesia Post Note  Patient: Terry [name]  Procedure(s) Performed: Procedure(s) (LRB): HEMORRHOIDECTOMY (N/A)  Patient location during evaluation: PACU Anesthesia Type: General Level of consciousness: awake, patient cooperative, awake and alert, oriented and responds to stimulation Pain management: pain level controlled Vital Signs Assessment: post-procedure vital signs reviewed and stable Respiratory status: spontaneous breathing Cardiovascular status: blood pressure returned to baseline Anesthetic complications: no Comments: Pt arrived with A-Flutter, still in A-flutter. BP stable in PACU.  Metoprolol 2 mg adm in OR.  Dr. Marcos EkeGonzales aware.    Last Vitals:  Filed Vitals:   10/01/15 1030 10/01/15 1035  BP: 120/72 119/74  Pulse:    Temp:    Resp: 14 14    Last Pain:  Filed Vitals:   10/01/15 1047  PainSc: 2                  [name]

## 2015-10-01 NOTE — Discharge Instructions (Signed)
Surgical Procedures for Hemorrhoids, Care After °Refer to this sheet in the next few weeks. These instructions provide you with information about caring for yourself after your procedure. Your health care provider may also give you more specific instructions. Your treatment has been planned according to current medical practices, but problems sometimes occur. Call your health care provider if you have any problems or questions after your procedure. °WHAT TO EXPECT AFTER THE PROCEDURE °After your procedure, it is common to have: °· Rectal pain. °· Pain when you are having a bowel movement. °· Slight rectal bleeding. °HOME CARE INSTRUCTIONS °Medicines °· Take over-the-counter and prescription medicines only as told by your health care provider. °· Do not drive or operate heavy machinery while taking prescription pain medicine. °· Use a stool softener or a bulk laxative as told by your health care provider. °Activities °· Rest at home. Return to your normal activities as told by your health care provider. °· Do not lift anything that is heavier than 10 lb (4.5 kg). °· Do not sit for long periods of time. Take a walk every day or as told by your health care provider. °· Do not strain to have a bowel movement. Do not spend a long time sitting on the toilet. °Eating and Drinking °· Eat foods that contain fiber, such as whole grains, beans, nuts, fruits, and vegetables. °· Drink enough fluid to keep your urine clear or pale yellow. °General Instructions °· Sit in a warm bath 2-3 times per day to relieve soreness or itching. °· Keep all follow-up visits as told by your health care provider. This is important. °SEEK MEDICAL CARE IF: °· Your pain medicine is not helping. °· You have a fever or chills. °· You become constipated. °· You have trouble passing urine. °SEEK IMMEDIATE MEDICAL CARE IF: °· You have very bad rectal pain. °· You have heavy bleeding from your rectum. °  °This information is not intended to replace advice  given to you by your health care provider. Make sure you discuss any questions you have with your health care provider. °  °Document Released: 09/30/2003 Document Revised: 03/31/2015 Document Reviewed: 10/05/2014 °Elsevier Interactive Patient Education ©2016 [employer] ° °

## 2015-10-01 NOTE — Progress Notes (Addendum)
Progress Note   Terry [name] [id number] DOB: 11/14/1955 [name]: 09/29/2015 PCP: No PCP Per Patient   Brief Narrative:   Terry [name] is an 60 y.o. male with a PMH of perirectal abscess 12/2013, who was admitted on 09/29/15 with a recurrent perirectal abscess.  Assessment/Plan:   Principal Problem:   Perirectal abscess / hemorrhoids - Continue ceftriaxone and metronidazole. D/C home on Cipro/Flagyl - Evaluated by Dr. Lovell SheehanJenkins of surgery, s/p examination under anesthesia, simple hemorrhoidectomy. - Continue pain relief efforts. Morphine not helping.  Change to dilaudid 1 mg Q 4 hours PRN.  Active Problems:    PAF with RVR - Went into afib perioperatively.  Given a cardizem bolus of 15 mg total, then started on PO cardizem. - Maybe secondary to pain (adrenergic response).  CHADSvasc2 is 0, so no need for anti-coagulation. - Check TSH, cycle troponins.    Transaminasemia - HIV negative, Hep B negative. - UDS negative. - LFTs improving.    DVT Prophylaxis - Lovenox ordered.   Family Communication/Anticipated D/C date and plan/Code Status   Family Communication: Wife at the bedside. Disposition Plan/date: Likely home 10/02/15 if afib resolved. Code Status: Full code.   IV Access:    Peripheral IV   Procedures and diagnostic studies:   Ct Pelvis W Contrast  09/29/2015  CLINICAL DATA:  Rectal pain and swelling for 3 days EXAM: CT PELVIS WITH CONTRAST TECHNIQUE: Multidetector CT imaging of the pelvis was performed using the standard protocol following the bolus administration of intravenous contrast. CONTRAST:  100mL OMNIPAQUE IOHEXOL 300 MG/ML  SOLN COMPARISON:  12/23/2013 FINDINGS: Urinary Tract:  No abnormality visualized. Bowel:  Unremarkable visualized pelvic bowel loops. Vascular/Lymphatic: No pathologically enlarged lymph nodes. No significant vascular abnormality seen. Reproductive:  No mass or other significant abnormality. Other: Perirectal fluid collection is  identified measuring 3.2 x 1.3 x 4.1 cm, image 41 of series 6 and image 69 of series 4. Musculoskeletal:  L5-S1 degenerative disc disease noted. IMPRESSION: 1. Recurrent perirectal abscess. Electronically Signed   By: Signa Kellaylor  Stroud M.D.   On: 09/29/2015 15:51     Medical Consultants:    None.  Anti-Infectives:   Rocephin 09/29/15---> Vancomycin 09/29/15---> Flagyl 09/29/15--->   Subjective:   Terry [name] has a lot of rectal discomfort.  Has required high doses of pain medication for pain control.  Irritable.  No N/V.  Was talking on phone when I went to assess him (did not get off), and was asleep when I returned.    Objective:    Filed Vitals:   09/30/15 1639 09/30/15 2152 10/01/15 0523 10/01/15 0946  BP: 113/86 156/95 123/79 122/90  Pulse: 92 100 69 141  Temp: 100.1 F (37.8 C) 101 F (38.3 C) 99.4 F (37.4 C) 98.9 F (37.2 C)  TempSrc: Oral Oral Oral Oral  Resp: 20 20 20 24   Height:    5\' 8"  (1.727 m)  Weight:    92.08 kg (203 lb)  SpO2: 98% 92% 93% 95%    Intake/Output Summary (Last 24 hours) at 10/01/15 0953 Last data filed at 09/30/15 1824  Gross per 24 hour  Intake    720 ml  Output      0 ml  Net    720 ml   Filed Weights   09/29/15 1154 09/29/15 1953 10/01/15 0946  Weight: 87.091 kg (192 lb) 92.216 kg (203 lb 4.8 oz) 92.08 kg (203 lb)    Exam: Gen:  NAD Cardiovascular:  HSIR, No M/R/G Respiratory:  Lungs CTAB Gastrointestinal:  Abdomen soft, NT/ND, + BS Extremities:  No C/E/C   Data Reviewed:    Labs: Basic Metabolic Panel:  Recent Labs Lab 09/29/15 1425 09/30/15 0610 10/01/15 0646  NA 138 138 137  K 4.4 4.4 3.8  CL 104 104 101  CO2 GLUCOSE 87 103* 117*  BUN CREATININE 0.89 0.96 0.94  CALCIUM 8.7* 8.4* 8.5*   GFR Estimated Creatinine Clearance: 90.9 mL/min (by C-G formula based on Cr of 0.94). Liver Function Tests:  Recent Labs Lab 09/29/15 1425 09/30/15 0610  AST 106* 55*  ALT 182* 124*  ALKPHOS 138*  123  BILITOT 1.5* 0.9  PROT 7.2 6.5  ALBUMIN 4.0 3.3*   CBC:  Recent Labs Lab 09/29/15 1427 09/30/15 0610  WBC 13.0* 13.2*  NEUTROABS 9.6*  --   HGB 14.4 12.9*  HCT 42.3 38.6*  MCV 85.8 86.4  PLT 235 253    Microbiology Recent Results (from the past 240 hour(s))  MRSA PCR Screening     Status: None   Collection Time: 09/29/15 11:00 PM  Result Value Ref Range Status   MRSA by PCR NEGATIVE NEGATIVE Final    Comment:        The GeneXpert MRSA Assay (FDA approved for NASAL specimens only), is one component of a comprehensive MRSA colonization surveillance program. It is not intended to diagnose MRSA infection nor to guide or monitor treatment for MRSA infections.      Medications:   . [MAR Hold] cefTRIAXone (ROCEPHIN)  IV  2 g Intravenous Q24H  . [MAR Hold] enoxaparin (LOVENOX) injection  40 mg Subcutaneous Q24H  . [MAR Hold] metronidazole  500 mg Intravenous Q8H  . [MAR Hold] vancomycin  1,000 mg Intravenous Q12H   Continuous Infusions:   Time spent: 25 minutes.   LOS: 2 days   RAMA,CHRISTINA  [facility] Pager [phone]. If unable to reach me by pager, please call my cell phone at [phone].  *Please refer to [email or web address], password [email or web address] to get updated schedule on who will round on this patient, as hospitalists switch teams weekly. If 7PM-7AM, please contact night-coverage at [email or web address], password [email or web address] for any overnight needs.  10/01/2015, 9:53 AM

## 2015-10-01 NOTE — Anesthesia Procedure Notes (Signed)
Procedure Name: LMA Insertion Date/Time: 10/01/2015 11:00 AM Performed by: Patrcia DollyMOSES, Alexis Reber Pre-anesthesia Checklist: Patient identified, Patient being monitored, Timeout performed, Emergency Drugs available and Suction available Patient Re-evaluated:Patient Re-evaluated prior to inductionOxygen Delivery Method: Circle System Utilized Preoxygenation: Pre-oxygenation with 100% oxygen Intubation Type: IV induction Ventilation: Mask ventilation without difficulty LMA Size: 5.0 Airway Equipment and Method: Stylet Placement Confirmation: positive ETCO2 and breath sounds checked- equal and bilateral Tube secured with: Tape Dental Injury: Teeth and Oropharynx as per pre-operative assessment

## 2015-10-01 NOTE — Transfer of Care (Signed)
Immediate Anesthesia Transfer of Care Note  Patient: Terry [name]  Procedure(s) Performed: Procedure(s): HEMORRHOIDECTOMY (N/A)  Patient Location: PACU  Anesthesia Type:General  Level of Consciousness: awake, alert , oriented, patient cooperative and responds to stimulation  Airway & Oxygen Therapy: Patient Spontanous Breathing and Patient connected to face mask oxygen  Post-op Assessment: Report given to RN, Post -op Vital signs reviewed and stable and Patient moving all extremities  Post vital signs: Reviewed and stable  Last Vitals:  Filed Vitals:   10/01/15 1030 10/01/15 1035  BP: 120/72 119/74  Pulse:    Temp:    Resp: 14 14    Complications: No apparent anesthesia complications

## 2015-10-02 ENCOUNTER — Inpatient Hospital Stay (HOSPITAL_COMMUNITY): Payer: Self-pay

## 2015-10-02 DIAGNOSIS — K649 Unspecified hemorrhoids: Secondary | ICD-10-CM

## 2015-10-02 DIAGNOSIS — K611 Rectal abscess: Principal | ICD-10-CM

## 2015-10-02 DIAGNOSIS — I4891 Unspecified atrial fibrillation: Secondary | ICD-10-CM

## 2015-10-02 DIAGNOSIS — R740 Nonspecific elevation of levels of transaminase and lactic acid dehydrogenase [LDH]: Secondary | ICD-10-CM

## 2015-10-02 LAB — ECHOCARDIOGRAM COMPLETE
AORTIC ROOT 2D: 26 mm
CHL CUP MV DEC (S): 99 ms
CHL CUP STROKE VOLUME: 36 mL
CHL CUP SV INDEX: 16.7 mL/m2
EWDT: 99 ms
FS: 39 % (ref 28–44)
HEIGHTINCHES: 68 in
IV/PV OW: 1.05
LA SIZE INDEX: 1.46 mm/m2
LA VOL 2D INDEX: 19.5 mL/m2
LA VOL 2D: 41.6 mL
LA diam end sys: 31 mm
LASIZE: 31 mm
LAVOL: 42.6 mL
LAVOLIN: 20 mL/m2
LV PW s: 11.1 mm
LV SIMPSON'S DISK: 55
LV sys vol index: 14 mL/m2
LV sys vol: 65 mL — AB (ref 21–61)
LVDIAVOL: 65 mL (ref 62–150)
LVDIAVOLIN: 31 mL/m2
LVIDD: 36.8 mm — AB (ref 3.5–6.0)
LVIDS: 22.4 mm — AB (ref 2.1–4.0)
MV Peak grad: 5 mmHg
MVPKEVEL: 114 cm/s
PW: 11.1 mm — AB (ref 0.6–1.1)
RV TAPSE: 21.3 mm
Single Plane A4C EF: 55 %
TRMAXVEL: 258 cm/s
TV PEAK RV-RA GRADIENT: 27 cm/s
WEIGHTICAEL: 3248 [oz_av]

## 2015-10-02 LAB — BASIC METABOLIC PANEL
ANION GAP: 7 (ref 5–15)
BUN: 16 mg/dL (ref 6–20)
CALCIUM: 8.1 mg/dL — AB (ref 8.9–10.3)
CO2: 29 mmol/L (ref 22–32)
Chloride: 100 mmol/L — ABNORMAL LOW (ref 101–111)
Creatinine, Ser: 1.13 mg/dL (ref 0.61–1.24)
GLUCOSE: 105 mg/dL — AB (ref 65–99)
POTASSIUM: 3.6 mmol/L (ref 3.5–5.1)
Sodium: 136 mmol/L (ref 135–145)

## 2015-10-02 LAB — CBC
HEMATOCRIT: 38.5 % — AB (ref 39.0–52.0)
Hemoglobin: 12.8 g/dL — ABNORMAL LOW (ref 13.0–17.0)
MCH: 28.8 pg (ref 26.0–34.0)
MCHC: 33.2 g/dL (ref 30.0–36.0)
MCV: 86.5 fL (ref 78.0–100.0)
Platelets: 243 10*3/uL (ref 150–400)
RBC: 4.45 MIL/uL (ref 4.22–5.81)
RDW: 12.9 % (ref 11.5–15.5)
WBC: 7.4 10*3/uL (ref 4.0–10.5)

## 2015-10-02 LAB — TROPONIN I

## 2015-10-02 MED ORDER — METOPROLOL TARTRATE 25 MG PO TABS
25.0000 mg | ORAL_TABLET | Freq: Two times a day (BID) | ORAL | Status: DC
  Administered 2015-10-02 – 2015-10-03 (×3): 25 mg via ORAL
  Filled 2015-10-02 (×3): qty 1

## 2015-10-02 MED ORDER — METRONIDAZOLE 500 MG PO TABS
500.0000 mg | ORAL_TABLET | Freq: Three times a day (TID) | ORAL | Status: DC
  Administered 2015-10-02 – 2015-10-03 (×2): 500 mg via ORAL
  Filled 2015-10-02 (×2): qty 1

## 2015-10-02 MED ORDER — DILTIAZEM HCL 60 MG PO TABS
60.0000 mg | ORAL_TABLET | Freq: Four times a day (QID) | ORAL | Status: DC
  Administered 2015-10-02 – 2015-10-03 (×4): 60 mg via ORAL
  Filled 2015-10-02 (×4): qty 1

## 2015-10-02 NOTE — Addendum Note (Signed)
Addendum  created 10/02/15 1322 by Despina Hiddenobert J Keziah Drotar, CRNA   Modules edited: Clinical Notes   Clinical Notes:  File: [id number]

## 2015-10-02 NOTE — Progress Notes (Signed)
Progress Note   Terry SafeRodney Mccue ZOX:[id number] DOB: 05/26/1956 [name]: 09/29/2015 PCP: No PCP Per Patient   Brief Narrative:   Terry [name] is an 60 y.o. male with a PMH of perirectal abscess 12/2013, who was admitted on 09/29/15 with a recurrent perirectal abscess.  Was found to be in fib/flutter when brought to OR.  Assessment/Plan:     Perirectal abscess / hemorrhoids - Continue ceftriaxone and metronidazole. D/C home on Cipro/Flagyl - Evaluated by Dr. Lovell SheehanJenkins of surgery, s/p examination under anesthesia, simple hemorrhoidectomy. - Continue pain relief efforts- not requiring IV currently    PAF with RVR - Went into afib perioperatively.  Given a cardizem bolus of 15 mg total, then started on PO cardizem. - CHADSvasc2 is 0, so no need for anti-coagulation. - TSH mildly high- repeat as outpatient -cycle troponins negative -added BB on 3/11 after speaking with cardiology -await echo    Transaminasemia - HIV negative, Hep B negative. - UDS negative. - LFTs improving.    DVT Prophylaxis - Lovenox ordered.   Family Communication/Anticipated D/C date and plan/Code Status   Family Communication: Wife at the bedside. Disposition Plan/date: await echo and HR control Code Status: Full code.   IV Access:    Peripheral IV   Procedures and diagnostic studies:   Ct Pelvis W Contrast  09/29/2015  CLINICAL DATA:  Rectal pain and swelling for 3 days EXAM: CT PELVIS WITH CONTRAST TECHNIQUE: Multidetector CT imaging of the pelvis was performed using the standard protocol following the bolus administration of intravenous contrast. CONTRAST:  100mL OMNIPAQUE IOHEXOL 300 MG/ML  SOLN COMPARISON:  12/23/2013 FINDINGS: Urinary Tract:  No abnormality visualized. Bowel:  Unremarkable visualized pelvic bowel loops. Vascular/Lymphatic: No pathologically enlarged lymph nodes. No significant vascular abnormality seen. Reproductive:  No mass or other significant abnormality. Other: Perirectal fluid  collection is identified measuring 3.2 x 1.3 x 4.1 cm, image 41 of series 6 and image 69 of series 4. Musculoskeletal:  L5-S1 degenerative disc disease noted. IMPRESSION: 1. Recurrent perirectal abscess. Electronically Signed   By: Signa Kellaylor  Stroud M.D.   On: 09/29/2015 15:51     Medical Consultants:    Dr. Lovell SheehanJenkins  Anti-Infectives:   Rocephin 09/29/15---> Vancomycin 09/29/15---> Flagyl 09/29/15--->   Subjective:   Pain is better controlled.  Still having trouble with HR... Especially when he ambulated -had palpitations prior as well  Objective:    Filed Vitals:   10/02/15 0555 10/02/15 0656 10/02/15 1007 10/02/15 1125  BP: 123/70 112/64 164/92 133/76  Pulse: 52 82 113 125  Temp:  98.6 F (37 C) 97.9 F (36.6 C)   TempSrc:  Oral Oral   Resp:  18 18   Height:      Weight:      SpO2:  98% 98%     Intake/Output Summary (Last 24 hours) at 10/02/15 1258 Last data filed at 10/02/15 0056  Gross per 24 hour  Intake      0 ml  Output    750 ml  Net   -750 ml   Filed Weights   09/29/15 1154 09/29/15 1953 10/01/15 0946  Weight: 87.091 kg (192 lb) 92.216 kg (203 lb 4.8 oz) 92.08 kg (203 lb)    Exam: Gen:  NAD Cardiovascular:  irr Respiratory:  Lungs CTAB Gastrointestinal:  Abdomen soft, NT/ND, + BS Extremities:  No C/E/C   Data Reviewed:    Labs: Basic Metabolic Panel:  Recent Labs Lab 09/29/15 1425 09/30/15 0610 10/01/15 0646 10/02/15 0345  NA  138 138 137 136  K 4.4 4.4 3.8 3.6  CL 104 104 101 100*  CO2 GLUCOSE 87 103* 117* 105*  BUN CREATININE 0.89 0.96 0.94 1.13  CALCIUM 8.7* 8.4* 8.5* 8.1*   GFR Estimated Creatinine Clearance: 75.6 mL/min (by C-G formula based on Cr of 1.13). Liver Function Tests:  Recent Labs Lab 09/29/15 1425 09/30/15 0610  AST 106* 55*  ALT 182* 124*  ALKPHOS 138* 123  BILITOT 1.5* 0.9  PROT 7.2 6.5  ALBUMIN 4.0 3.3*   CBC:  Recent Labs Lab 09/29/15 1427 09/30/15 0610 10/02/15 0345  WBC  13.0* 13.2* 7.4  NEUTROABS 9.6*  --   --   HGB 14.4 12.9* 12.8*  HCT 42.3 38.6* 38.5*  MCV 85.8 86.4 86.5  PLT 235 253 243    Microbiology Recent Results (from the past 240 hour(s))  MRSA PCR Screening     Status: None   Collection Time: 09/29/15 11:00 PM  Result Value Ref Range Status   MRSA by PCR NEGATIVE NEGATIVE Final    Comment:        The GeneXpert MRSA Assay (FDA approved for NASAL specimens only), is one component of a comprehensive MRSA colonization surveillance program. It is not intended to diagnose MRSA infection nor to guide or monitor treatment for MRSA infections.      Medications:   . cefTRIAXone (ROCEPHIN)  IV  2 g Intravenous Q24H  . diltiazem  60 mg Oral 4 times per day  . enoxaparin (LOVENOX) injection  40 mg Subcutaneous Q24H  . metoprolol tartrate  25 mg Oral BID  . metronidazole  500 mg Intravenous Q8H   Continuous Infusions:   Time spent: 25 minutes.   LOS: 3 days   Prabhjot Piscitello Juanetta Gosling  [facility] Pager [phone]  10/02/2015, 12:58 PM

## 2015-10-02 NOTE — Progress Notes (Signed)
1 Day Post-Op  Subjective: Minimal rectal pain.  Objective: Vital signs in last 24 hours: Temp:  [97.8 F (36.6 C)-98.7 F (37.1 C)] 97.9 F (36.6 C) (03/11 1007) Pulse Rate:  [42-113] 113 (03/11 1007) Resp:  [12-26] 18 (03/11 1007) BP: (95-164)/(53-123) 164/92 mmHg (03/11 1007) SpO2:  [92 %-100 %] 98 % (03/11 1007) Last BM Date: 10/02/15 (small per patient)  Intake/Output from previous day: 03/10 0701 - 03/11 0700 In: 800 [I.V.:800] Out: 750 [Urine:750] Intake/Output this shift:    General appearance: alert, cooperative and no distress Pelvic: No significant bleeding noted.  Lab Results:   Recent Labs  09/30/15 0610 10/02/15 0345  WBC 13.2* 7.4  HGB 12.9* 12.8*  HCT 38.6* 38.5*  PLT 253 243   BMET  Recent Labs  10/01/15 0646 10/02/15 0345  NA 137 136  K 3.8 3.6  CL 101 100*  CO2 25 29  GLUCOSE 117* 105*  BUN 12 16  CREATININE 0.94 1.13  CALCIUM 8.5* 8.1*   PT/INR No results for input(s): LABPROT, INR in the last 72 hours.  Studies/Results: Dg Chest 2 View  09/30/2015  CLINICAL DATA:  Preop for perirectal abscess.  Smoker. EXAM: CHEST  2 VIEW COMPARISON:  None. FINDINGS: Heart size is normal. Overall cardiomediastinal silhouette is normal in size and configuration. Mild scarring at the left lung base. Lungs appear otherwise clear. No pleural effusions seen. No pneumothorax seen. Osseous and soft tissue structures about the chest are unremarkable. IMPRESSION: Mild scarring at the left lung base. Lungs appear otherwise clear. Heart size is normal. Overall, no evidence of acute cardiopulmonary abnormality. Electronically Signed   By: Bary RichardStan  Maynard M.D.   On: 09/30/2015 13:45    Anti-infectives: Anti-infectives    Start     Dose/Rate Route Frequency Ordered Stop   09/30/15 0900  [MAR Hold]  vancomycin (VANCOCIN) IVPB 1000 mg/200 mL premix  Status:  Discontinued     (MAR Hold since 10/01/15 0934)   1,000 mg 200 mL/hr over 60 Minutes Intravenous Every 12  hours 09/29/15 2048 10/01/15 1430   09/30/15 0300  metroNIDAZOLE (FLAGYL) IVPB 500 mg     500 mg 100 mL/hr over 60 Minutes Intravenous Every 8 hours 09/29/15 2022     09/29/15 2200  vancomycin (VANCOCIN) 1,500 mg in sodium chloride 0.9 % 500 mL IVPB     1,500 mg 250 mL/hr over 120 Minutes Intravenous  Once 09/29/15 2048 09/30/15 0056   09/29/15 2100  cefTRIAXone (ROCEPHIN) 2 g in dextrose 5 % 50 mL IVPB     2 g 100 mL/hr over 30 Minutes Intravenous Every 24 hours 09/29/15 2022     09/29/15 1645  ciprofloxacin (CIPRO) IVPB 400 mg     400 mg 200 mL/hr over 60 Minutes Intravenous  Once 09/29/15 1631 09/29/15 1800   09/29/15 1645  metroNIDAZOLE (FLAGYL) IVPB 500 mg     500 mg 100 mL/hr over 60 Minutes Intravenous  Once 09/29/15 1631 09/29/15 2005      Assessment/Plan: s/p Procedure(s): HEMORRHOIDECTOMY Impression: Healing well from surgical standpoint. Newly diagnosed paroxysmal atrial tachycardia being addressed by hospitals. Would continue ciprofloxacin and Flagyl for 10 days upon discharge.  LOS: 3 days    Zionah Criswell A 10/02/2015

## 2015-10-02 NOTE — Anesthesia Postprocedure Evaluation (Signed)
Anesthesia Post Note  Patient: Terry [name]  Procedure(s) Performed: Procedure(s) (LRB): HEMORRHOIDECTOMY (N/A)  Patient location during evaluation: Nursing Unit Anesthesia Type: General Level of consciousness: awake and alert, oriented and patient cooperative Pain management: pain level controlled Vital Signs Assessment: post-procedure vital signs reviewed and stable Respiratory status: spontaneous breathing Cardiovascular status: blood pressure returned to baseline Postop Assessment: no signs of nausea or vomiting Anesthetic complications: no    Last Vitals:  Filed Vitals:   10/02/15 1007 10/02/15 1125  BP: 164/92 133/76  Pulse: 113 125  Temp: 36.6 C   Resp: 18     Last Pain:  Filed Vitals:   10/02/15 1126  PainSc: 3                  [name]

## 2015-10-03 LAB — CBC
HCT: 36.8 % — ABNORMAL LOW (ref 39.0–52.0)
Hemoglobin: 12.3 g/dL — ABNORMAL LOW (ref 13.0–17.0)
MCH: 29.1 pg (ref 26.0–34.0)
MCHC: 33.4 g/dL (ref 30.0–36.0)
MCV: 87 fL (ref 78.0–100.0)
PLATELETS: 297 10*3/uL (ref 150–400)
RBC: 4.23 MIL/uL (ref 4.22–5.81)
RDW: 13.1 % (ref 11.5–15.5)
WBC: 6.7 10*3/uL (ref 4.0–10.5)

## 2015-10-03 LAB — BASIC METABOLIC PANEL
Anion gap: 7 (ref 5–15)
BUN: 13 mg/dL (ref 6–20)
CALCIUM: 8.5 mg/dL — AB (ref 8.9–10.3)
CHLORIDE: 105 mmol/L (ref 101–111)
CO2: 30 mmol/L (ref 22–32)
Creatinine, Ser: 1.08 mg/dL (ref 0.61–1.24)
GFR calc Af Amer: >60 mL/min (ref >60)
GFR calc non Af Amer: >60 mL/min (ref >60)
Glucose, Bld: 103 mg/dL — ABNORMAL HIGH (ref 65–99)
POTASSIUM: 3.9 mmol/L (ref 3.5–5.1)
SODIUM: 142 mmol/L (ref 135–145)

## 2015-10-03 MED ORDER — OXYCODONE HCL 10 MG PO TABS: 10.0000 mg | ORAL_TABLET | ORAL | Status: AC | PRN

## 2015-10-03 MED ORDER — DILTIAZEM HCL 30 MG PO TABS: 30.0000 mg | ORAL_TABLET | Freq: Four times a day (QID) | ORAL | Status: AC | PRN

## 2015-10-03 MED ORDER — CIPROFLOXACIN HCL 250 MG PO TABS
500.0000 mg | ORAL_TABLET | Freq: Two times a day (BID) | ORAL | Status: DC
  Administered 2015-10-03: 500 mg via ORAL
  Filled 2015-10-03: qty 2

## 2015-10-03 MED ORDER — METOPROLOL TARTRATE 25 MG PO TABS: 25.0000 mg | ORAL_TABLET | Freq: Two times a day (BID) | ORAL | Status: AC

## 2015-10-03 MED ORDER — CIPROFLOXACIN HCL 500 MG PO TABS: 500.0000 mg | ORAL_TABLET | Freq: Two times a day (BID) | ORAL | Status: AC

## 2015-10-03 MED ORDER — DILTIAZEM HCL 30 MG PO TABS: 30.0000 mg | ORAL_TABLET | Freq: Four times a day (QID) | ORAL | Status: DC | PRN

## 2015-10-03 MED ORDER — METRONIDAZOLE 500 MG PO TABS: 500.0000 mg | ORAL_TABLET | Freq: Three times a day (TID) | ORAL | Status: AC

## 2015-10-03 NOTE — Discharge Summary (Signed)
Physician Discharge Summary  Terry [name] SJG:[id number] DOB: 03-Jun-1956 [name]: 09/29/2015  PCP: No PCP Per Patient  Admit date: 09/29/2015 Discharge date: 10/03/2015  Time spent: 35 minutes  Recommendations for Outpatient Follow-up:  1. Patient to establish with PCP for follow up   Discharge Diagnoses:  Principal Problem:   Perirectal abscess Active Problems:   Transaminasemia   Hemorrhoid   Atrial fibrillation with RVR [facility])   Discharge Condition: improved  Diet recommendation: cardiac  Filed Weights   09/29/15 1154 09/29/15 1953 10/01/15 0946  Weight: 87.091 kg (192 lb) 92.216 kg (203 lb 4.8 oz) 92.08 kg (203 lb)    History of present illness:  60 y/o male with no previous medical problems presents with 3 day history of rectal pain. He states that he has had a perirectal abscess in the past that was surgically drained [name]. He tried to call [name] office today, but was unable to get an appointment and was told to come to the emergency department to seek further medical care. The patient noted some pain in his rectum with defecation 3 days ago which has progressively worsened during this period of time. He denies placing any foreign bodies in his rectum. He denies any recent injuries. He denies any fevers, chills, nausea, vomiting, diarrhea. There is no hematochezia or melena. He denies any abdominal pain, dysuria, hematuria. The patient states that he is not sexually active and denies any previous history of STDs. The patient denies any alcohol or illegal drug use. In the emergency department, the patient was afebrile hemodynamically stable. He was started on ciprofloxacin and metronidazole. WBC was 13.0. AST 106, ALT 182, ALK PHOSPHATASE 138, TOTAL BILIRUBIN 1.5. BMP WAS UNREMARKABLE. CT pelvis in the emergency department revealed a 3.2 x 1.3 x 4.1 cm fluid collection in the perirectal area  Hospital Course:  Perirectal abscess / hemorrhoids - Continue ceftriaxone and  metronidazole. D/C home on Cipro/Flagyl total of 10 days - Evaluated by [name] of surgery, s/p examination under anesthesia, simple hemorrhoidectomy. - Continue pain relief efforts   PAF with RVR - Went into afib perioperatively. Given a cardizem bolus of 15 mg total, then started on PO cardizem as well as metoprolol - CHADSvasc2 is 0, so no need for anti-coagulation. - TSH mildly high- repeat as outpatient -cycle troponins negative -added BB on 3/11 after speaking with cardiology -echo as below -back in sinus as of 3/12 AM at 7:18   Transaminasemia - HIV negative, Hep B negative. - UDS negative. - LFTs improving.  Procedures: Echo: Left ventricle: The cavity size was normal. Wall thickness was  normal. Systolic function was normal. The estimated ejection  fraction was in the range of 60% to 65%. Wall motion was normal;  there were no regional wall motion abnormalities.  Consultations: Surgery- [facility]  Discharge Exam: Filed Vitals:   10/03/15 0555 10/03/15 0930  BP: 107/69 141/69  Pulse: 77 81  Temp: 98.3 F (36.8 C) 97.7 F (36.5 C)  Resp: 18 20    General: awake, NAD   Discharge Instructions   Discharge Instructions    Diet general    Complete by:  As directed      Discharge instructions    Complete by:  As directed   Establish with PCP Follow up with [name] Bowel regimen for soft "toothpaste" consistency     Increase activity slowly    Complete by:  As directed           Discharge Medication List  as of 10/03/2015  9:46 AM    START taking these medications   Details  diltiazem (CARDIZEM) 30 MG tablet Take 1 tablet (30 mg total) by mouth every 6 (six) hours as needed (heart racing)., Starting 10/03/2015, Until Discontinued, Print    metoprolol tartrate (LOPRESSOR) 25 MG tablet Take 1 tablet (25 mg total) by mouth 2 (two) times daily., Starting 10/03/2015, Until Discontinued, Print    oxyCODONE 10 MG TABS Take 1 tablet (10 mg total) by  mouth every 4 (four) hours as needed for moderate pain., Starting 10/03/2015, Until Discontinued, Print      CONTINUE these medications which have CHANGED   Details  ciprofloxacin (CIPRO) 500 MG tablet Take 1 tablet (500 mg total) by mouth 2 (two) times daily., Starting 10/03/2015, Until Discontinued, Print    metroNIDAZOLE (FLAGYL) 500 MG tablet Take 1 tablet (500 mg total) by mouth every 8 (eight) hours., Starting 10/03/2015, Until Discontinued, Print      CONTINUE these medications which have NOT CHANGED   Details  acetaminophen (TYLENOL) 500 MG tablet Take 2,000 mg by mouth daily as needed for moderate pain. , Until Discontinued, Historical Med    ibuprofen (ADVIL,MOTRIN) 800 MG tablet Take 800 mg by mouth every 8 (eight) hours as needed., Until Discontinued, Historical Med    Docusate Sodium (DULCOLAX STOOL SOFTENER PO) Take 3 tablets by mouth daily as needed (constipation). Reported on 09/29/2015, Until Discontinued, Historical Med    polyethylene glycol (MIRALAX / GLYCOLAX) packet Take 17 g by mouth daily., Starting 12/23/2013, Until Discontinued, Print      STOP taking these medications     HYDROcodone-acetaminophen (NORCO/VICODIN) 5-325 MG per tablet      oxyCODONE-acetaminophen (PERCOCET/ROXICET) 5-325 MG tablet        No Known Allergies Follow-up Information    Follow up with Jamesetta So, MD. Schedule an appointment as soon as possible for a visit on 10/07/2015.   Specialty:  General Surgery   Contact information:   [address] [phone]       Please follow up.   Why:  establish with PCP       The results of significant diagnostics from this hospitalization (including imaging, microbiology, ancillary and laboratory) are listed below for reference.    Significant Diagnostic Studies: Dg Chest 2 View  09/30/2015  CLINICAL DATA:  Preop for perirectal abscess.  Smoker. EXAM: CHEST  2 VIEW COMPARISON:  None. FINDINGS: Heart size is  normal. Overall cardiomediastinal silhouette is normal in size and configuration. Mild scarring at the left lung base. Lungs appear otherwise clear. No pleural effusions seen. No pneumothorax seen. Osseous and soft tissue structures about the chest are unremarkable. IMPRESSION: Mild scarring at the left lung base. Lungs appear otherwise clear. Heart size is normal. Overall, no evidence of acute cardiopulmonary abnormality. Electronically Signed   By: Franki Cabot M.D.   On: 09/30/2015 13:45   Ct Pelvis W Contrast  09/29/2015  CLINICAL DATA:  Rectal pain and swelling for 3 days EXAM: CT PELVIS WITH CONTRAST TECHNIQUE: Multidetector CT imaging of the pelvis was performed using the standard protocol following the bolus administration of intravenous contrast. CONTRAST:  13m OMNIPAQUE IOHEXOL 300 MG/ML  SOLN COMPARISON:  12/23/2013 FINDINGS: Urinary Tract:  No abnormality visualized. Bowel:  Unremarkable visualized pelvic bowel loops. Vascular/Lymphatic: No pathologically enlarged lymph nodes. No significant vascular abnormality seen. Reproductive:  No mass or other significant abnormality. Other: Perirectal fluid collection is identified measuring 3.2 x 1.3 x 4.1 cm,  image 41 of series 6 and image 69 of series 4. Musculoskeletal:  L5-S1 degenerative disc disease noted. IMPRESSION: 1. Recurrent perirectal abscess. Electronically Signed   By: Kerby Moors M.D.   On: 09/29/2015 15:51    Microbiology: Recent Results (from the past 240 hour(s))  MRSA PCR Screening     Status: None   Collection Time: 09/29/15 11:00 PM  Result Value Ref Range Status   MRSA by PCR NEGATIVE NEGATIVE Final    Comment:        The GeneXpert MRSA Assay (FDA approved for NASAL specimens only), is one component of a comprehensive MRSA colonization surveillance program. It is not intended to diagnose MRSA infection nor to guide or monitor treatment for MRSA infections.      Labs: Basic Metabolic Panel:  Recent Labs Lab  09/29/15 1425 09/30/15 0610 10/01/15 0646 10/02/15 0345 10/03/15 0624  NA 138 138 137 136 142  K 4.4 4.4 3.8 3.6 3.9  CL 104 104 101 100* 105  CO2 24 26 25 29 30   GLUCOSE 87 103* 117* 105* 103*  BUN 11 11 12 16 13   CREATININE 0.89 0.96 0.94 1.13 1.08  CALCIUM 8.7* 8.4* 8.5* 8.1* 8.5*   Liver Function Tests:  Recent Labs Lab 09/29/15 1425 09/30/15 0610  AST 106* 55*  ALT 182* 124*  ALKPHOS 138* 123  BILITOT 1.5* 0.9  PROT 7.2 6.5  ALBUMIN 4.0 3.3*   No results for input(s): LIPASE, AMYLASE in the last 168 hours. No results for input(s): AMMONIA in the last 168 hours. CBC:  Recent Labs Lab 09/29/15 1427 09/30/15 0610 10/02/15 0345 10/03/15 0624  WBC 13.0* 13.2* 7.4 6.7  NEUTROABS 9.6*  --   --   --   HGB 14.4 12.9* 12.8* 12.3*  HCT 42.3 38.6* 38.5* 36.8*  MCV 85.8 86.4 86.5 87.0  PLT 235 253 243 297   Cardiac Enzymes:  Recent Labs Lab 10/01/15 1512 10/01/15 2236 10/02/15 0343  TROPONINI <0.03 <0.03 <0.03   BNP: BNP (last 3 results) No results for input(s): BNP in the last 8760 hours.  ProBNP (last 3 results) No results for input(s): PROBNP in the last 8760 hours.  CBG: No results for input(s): GLUCAP in the last 168 hours.     Signed:  Doren Kaspar Alison Stalling DO  [facility] 10/03/2015, 1:08 PM

## 2015-10-03 NOTE — Progress Notes (Signed)
Patient's IV removed.  Site WNL.  AVS reviewed with patient.  Verbalized understanding of discharge instructions, physician follow-up, medications.  5 prescriptions given to patient (include prescription for pain medication).  Patient to establish PCP as soon as possible and arrange appointment to follow-up for heart rate Dr. Benjamine MolaVann - patient verbalized understanding.  Patient reports belongings intact and in possession at time of discharge.  Patient refused wheelchair.  Ambulated to main entrance for discharge escorted by NT.  Patient stable at time of discharge.

## 2015-10-04 ENCOUNTER — Encounter (HOSPITAL_COMMUNITY): Payer: Self-pay | Admitting: General Surgery

## 2017-02-22 IMAGING — CT CT PELVIS W/ CM
2 of 4 series · 16 of 46 positions shown, 18 images · IV contrast (Omnipaque 300)
Comparison: 12/23/2013

CLINICAL DATA: Rectal pain and swelling for 3 days

EXAM:
CT PELVIS WITH CONTRAST
TECHNIQUE: Multidetector CT imaging of the pelvis was performed using the
standard protocol following the bolus administration of intravenous
contrast.
CONTRAST:  100mL OMNIPAQUE IOHEXOL 300 MG/ML  SOLN

[Series 2: pelvis 5.0 b40f · axial · 0.83mm/px · z∈[-472,-212]mm · 13 of 60 slices shown, 15 images]
[im 4/60  soft-tissue]
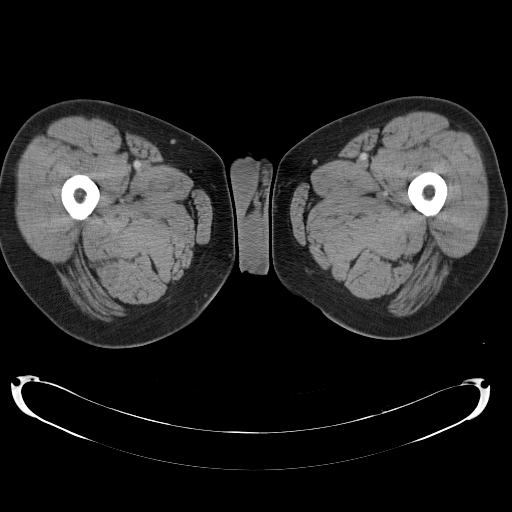
[im 4/60  bone]
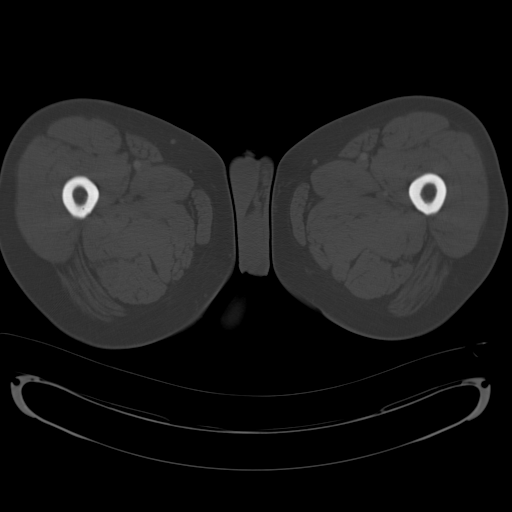
[im 8/60  soft-tissue]
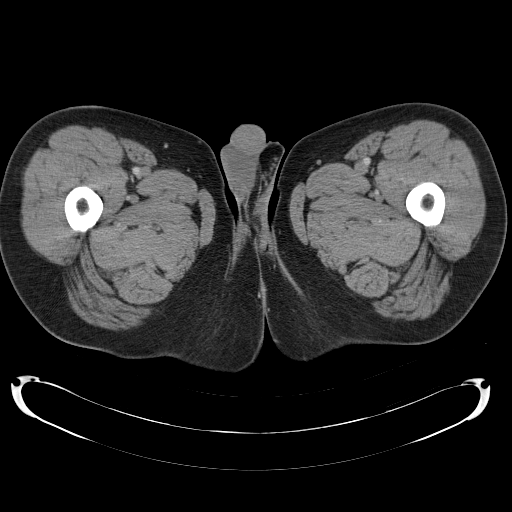
[im 12/60  soft-tissue]
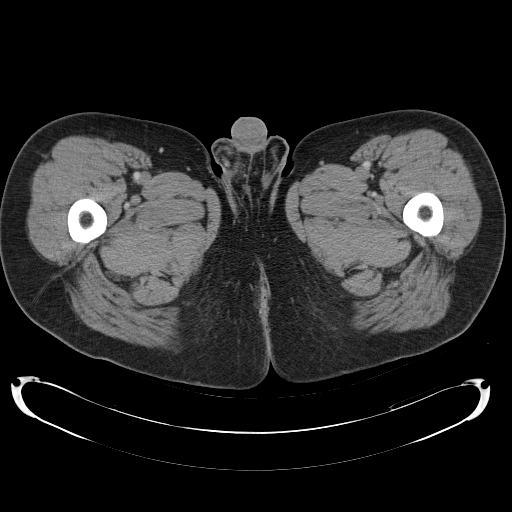
[im 16/60  soft-tissue]
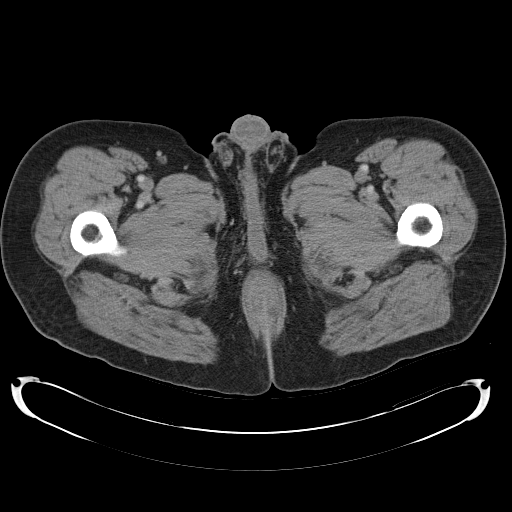
[im 20/60  soft-tissue]
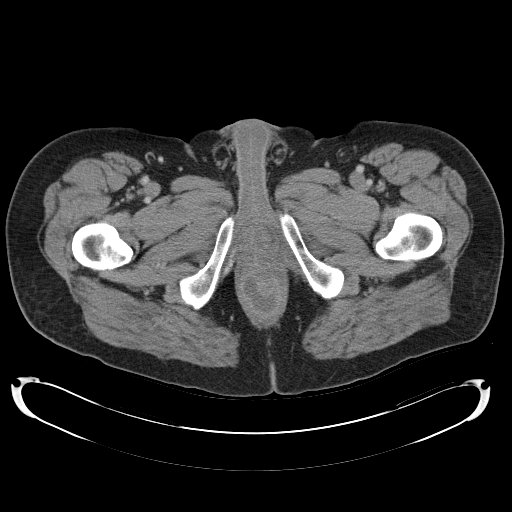
[im 24/60  soft-tissue]
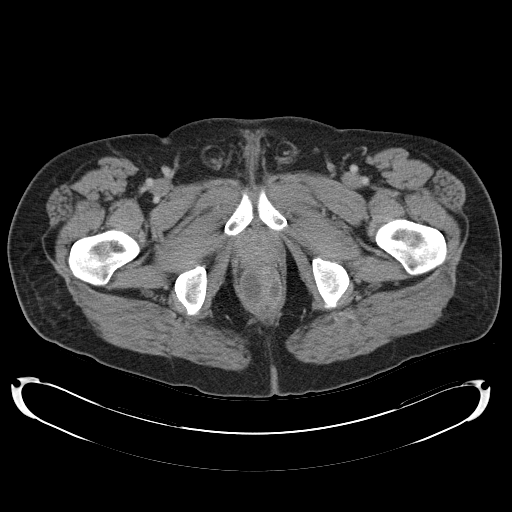
[im 32/60  soft-tissue]
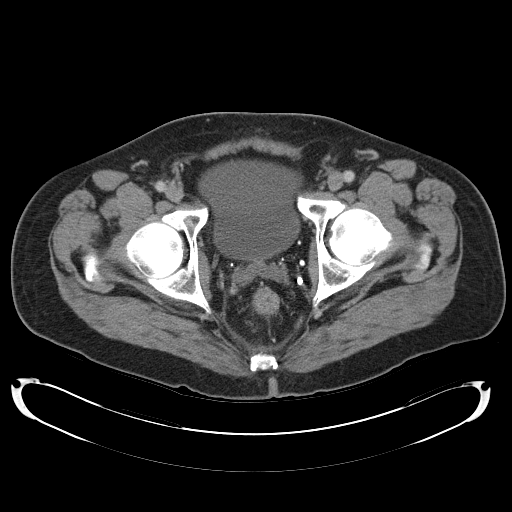
[im 36/60  soft-tissue]
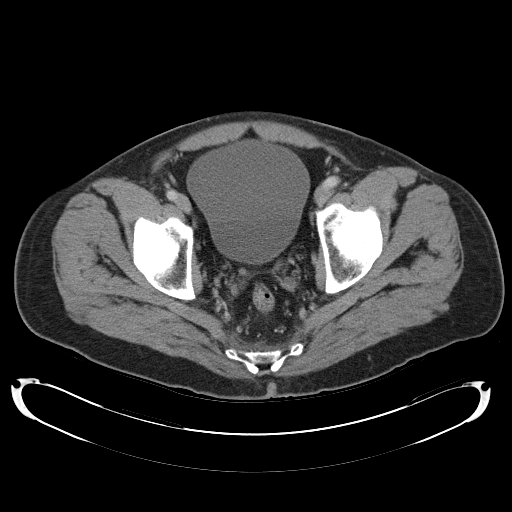
[im 40/60  soft-tissue]
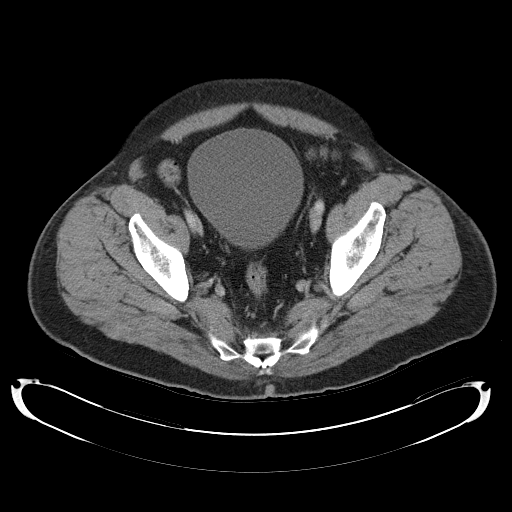
[im 40/60  bone]
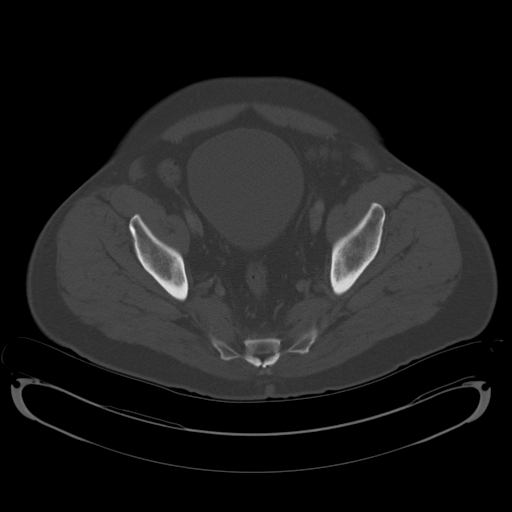
[im 44/60  soft-tissue]
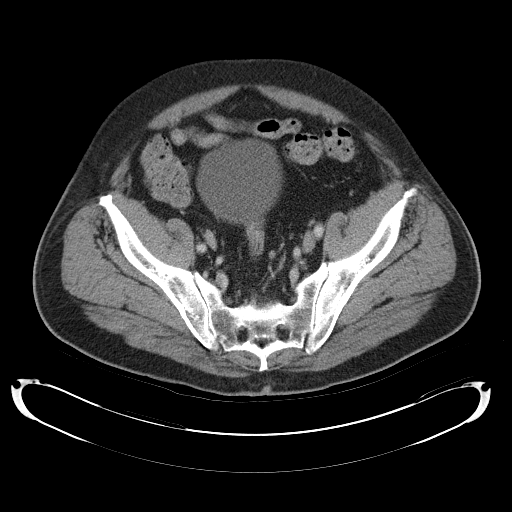
[im 48/60  soft-tissue]
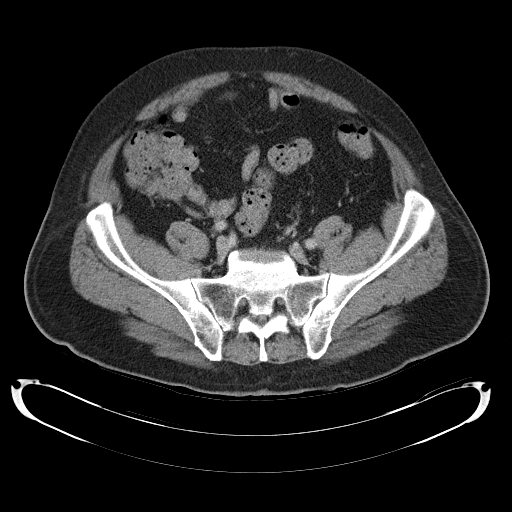
[im 52/60  soft-tissue]
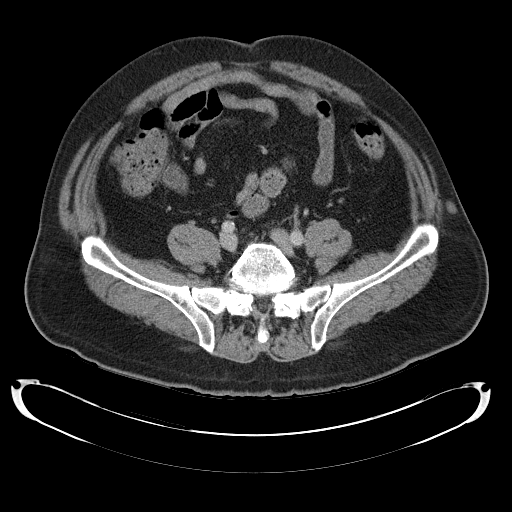
[im 56/60  soft-tissue]
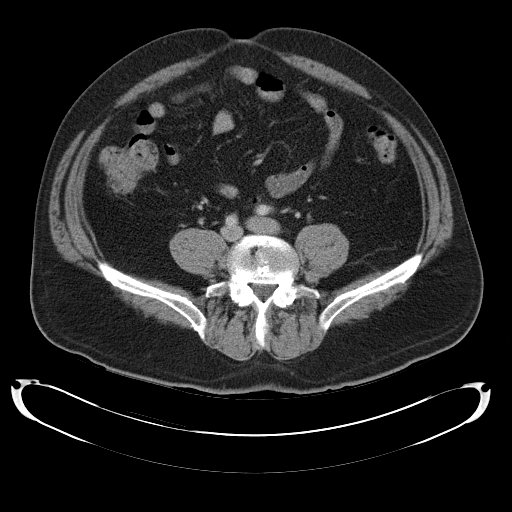

[Series 3: mpr coronal a/p cor · coronal · 0.60mm/px · 3 of 101 slices shown]
[im 34/101  soft-tissue]
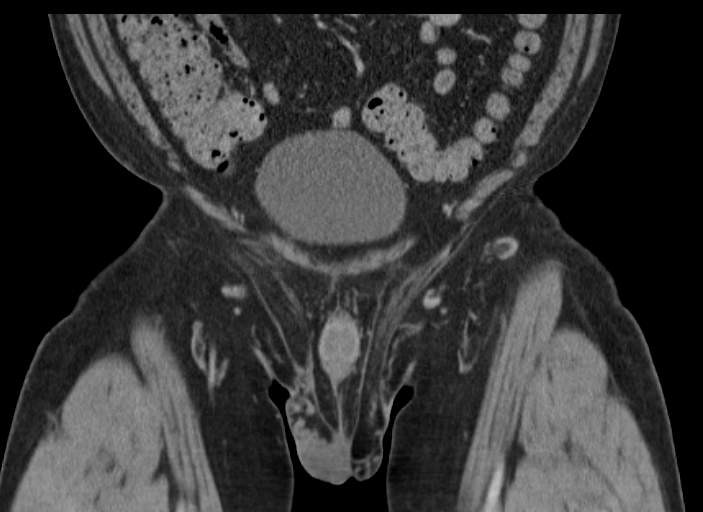
[im 45/101  soft-tissue]
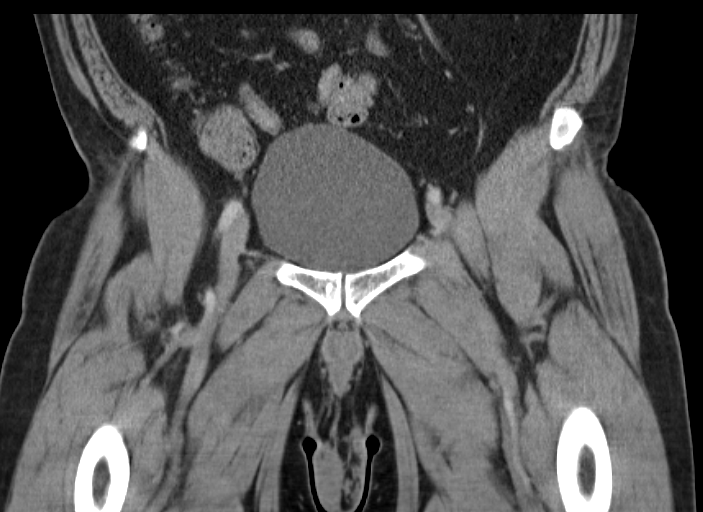
[im 56/101  soft-tissue]
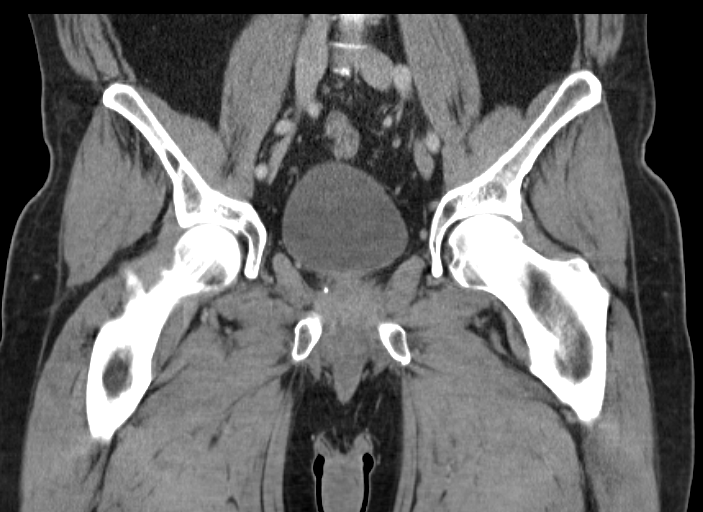

[16 of 46 positions shown; findings below may reference images not displayed]

FINDINGS: Urinary Tract:  No abnormality visualized.

Bowel:  Unremarkable visualized pelvic bowel loops.

Vascular/Lymphatic: No pathologically enlarged lymph nodes. No
significant vascular abnormality seen.

Reproductive:  No mass or other significant abnormality.

Other: Perirectal fluid collection is identified measuring 3.2 x
x 4.1 cm, image 41 of series 6 and image 69 of series 4.

Musculoskeletal:  L5-S1 degenerative disc disease noted.
IMPRESSION: 1. Recurrent perirectal abscess.
